# Patient Record
Sex: Male | Born: 1950
Health system: Southern US, Community
[De-identification: ages and names within clinical notes are randomized; demographics above are authoritative.]

## PROBLEM LIST (undated history)

## (undated) DIAGNOSIS — G4700 Insomnia, unspecified: Secondary | ICD-10-CM

## (undated) DIAGNOSIS — E785 Hyperlipidemia, unspecified: Secondary | ICD-10-CM

## (undated) DIAGNOSIS — I2510 Atherosclerotic heart disease of native coronary artery without angina pectoris: Secondary | ICD-10-CM

## (undated) DIAGNOSIS — M549 Dorsalgia, unspecified: Secondary | ICD-10-CM

## (undated) DIAGNOSIS — Z9289 Personal history of other medical treatment: Secondary | ICD-10-CM

## (undated) DIAGNOSIS — I219 Acute myocardial infarction, unspecified: Secondary | ICD-10-CM

## (undated) DIAGNOSIS — J189 Pneumonia, unspecified organism: Secondary | ICD-10-CM

## (undated) DIAGNOSIS — IMO0001 Reserved for inherently not codable concepts without codable children: Secondary | ICD-10-CM

## (undated) DIAGNOSIS — R351 Nocturia: Secondary | ICD-10-CM

## (undated) HISTORY — PX: HERNIA REPAIR: SHX51

## (undated) HISTORY — DX: Hyperlipidemia, unspecified: E78.5

## (undated) HISTORY — PX: TONSILLECTOMY AND ADENOIDECTOMY: SHX28

---

## 2001-06-01 ENCOUNTER — Emergency Department (HOSPITAL_COMMUNITY): Admission: EM | Admit: 2001-06-01 | Discharge: 2001-06-01 | Payer: Self-pay | Admitting: Emergency Medicine

## 2001-06-21 ENCOUNTER — Emergency Department (HOSPITAL_COMMUNITY): Admission: EM | Admit: 2001-06-21 | Discharge: 2001-06-21 | Payer: Self-pay | Admitting: Emergency Medicine

## 2012-01-03 ENCOUNTER — Encounter: Payer: Self-pay | Admitting: Family Medicine

## 2012-02-15 ENCOUNTER — Ambulatory Visit (INDEPENDENT_AMBULATORY_CARE_PROVIDER_SITE_OTHER): Payer: Managed Care, Other (non HMO) | Admitting: Family Medicine

## 2012-02-15 ENCOUNTER — Encounter: Payer: Self-pay | Admitting: Family Medicine

## 2012-02-15 ENCOUNTER — Ambulatory Visit: Payer: Self-pay

## 2012-02-15 VITALS — BP 115/76 | HR 101 | Temp 97.7°F | Resp 18 | Ht 66.5 in | Wt 156.0 lb

## 2012-02-15 DIAGNOSIS — R5381 Other malaise: Secondary | ICD-10-CM

## 2012-02-15 DIAGNOSIS — F17200 Nicotine dependence, unspecified, uncomplicated: Secondary | ICD-10-CM

## 2012-02-15 DIAGNOSIS — E7800 Pure hypercholesterolemia, unspecified: Secondary | ICD-10-CM

## 2012-02-15 DIAGNOSIS — Z720 Tobacco use: Secondary | ICD-10-CM

## 2012-02-15 DIAGNOSIS — Z125 Encounter for screening for malignant neoplasm of prostate: Secondary | ICD-10-CM

## 2012-02-15 DIAGNOSIS — Z0000 Encounter for general adult medical examination without abnormal findings: Secondary | ICD-10-CM

## 2012-02-15 DIAGNOSIS — R634 Abnormal weight loss: Secondary | ICD-10-CM

## 2012-02-15 DIAGNOSIS — T148XXA Other injury of unspecified body region, initial encounter: Secondary | ICD-10-CM

## 2012-02-15 DIAGNOSIS — R5383 Other fatigue: Secondary | ICD-10-CM

## 2012-02-15 LAB — POCT URINALYSIS DIPSTICK
Blood, UA: NEGATIVE
Glucose, UA: NEGATIVE
Ketones, UA: NEGATIVE
Leukocytes, UA: NEGATIVE
Nitrite, UA: NEGATIVE
Protein, UA: NEGATIVE
Spec Grav, UA: >=1.03
Urobilinogen, UA: 0.2
pH, UA: 5.5

## 2012-02-15 LAB — COMPREHENSIVE METABOLIC PANEL
ALT: 28 U/L (ref 0–53)
AST: 26 U/L (ref 0–37)
Albumin: 3.8 g/dL (ref 3.5–5.2)
Alkaline Phosphatase: 81 U/L (ref 39–117)
BUN: 16 mg/dL (ref 6–23)
CO2: 26 mEq/L (ref 19–32)
Calcium: 9.2 mg/dL (ref 8.4–10.5)
Chloride: 105 mEq/L (ref 96–112)
Creat: 0.93 mg/dL (ref 0.50–1.35)
Glucose, Bld: 87 mg/dL (ref 70–99)
Potassium: 4.4 mEq/L (ref 3.5–5.3)
Sodium: 140 mEq/L (ref 135–145)
Total Bilirubin: 0.3 mg/dL (ref 0.3–1.2)
Total Protein: 7.3 g/dL (ref 6.0–8.3)

## 2012-02-15 LAB — LIPID PANEL
Cholesterol: 229 mg/dL — ABNORMAL HIGH (ref 0–200)
HDL: 69 mg/dL (ref >39)
LDL Cholesterol: 145 mg/dL — ABNORMAL HIGH (ref 0–99)
Total CHOL/HDL Ratio: 3.3 Ratio
Triglycerides: 75 mg/dL (ref <150)
VLDL: 15 mg/dL (ref 0–40)

## 2012-02-15 LAB — CBC WITH DIFFERENTIAL/PLATELET
Basophils Absolute: 0.1 10*3/uL (ref 0.0–0.1)
Basophils Relative: 1 % (ref 0–1)
Eosinophils Absolute: 0.3 10*3/uL (ref 0.0–0.7)
Eosinophils Relative: 5 % (ref 0–5)
HCT: 43.8 % (ref 39.0–52.0)
Hemoglobin: 15.1 g/dL (ref 13.0–17.0)
Lymphocytes Relative: 38 % (ref 12–46)
Lymphs Abs: 2.4 10*3/uL (ref 0.7–4.0)
MCH: 29.7 pg (ref 26.0–34.0)
MCHC: 34.5 g/dL (ref 30.0–36.0)
MCV: 86.1 fL (ref 78.0–100.0)
Monocytes Absolute: 0.7 10*3/uL (ref 0.1–1.0)
Monocytes Relative: 11 % (ref 3–12)
Neutro Abs: 2.8 10*3/uL (ref 1.7–7.7)
Neutrophils Relative %: 45 % (ref 43–77)
Platelets: 223 10*3/uL (ref 150–400)
RBC: 5.09 MIL/uL (ref 4.22–5.81)
RDW: 14.6 % (ref 11.5–15.5)
WBC: 6.2 10*3/uL (ref 4.0–10.5)

## 2012-02-15 LAB — IFOBT (OCCULT BLOOD): IFOBT: NEGATIVE

## 2012-02-15 LAB — PSA: PSA: 1.13 ng/mL (ref <=4.00)

## 2012-02-15 NOTE — Patient Instructions (Addendum)
Keeping you healthy  Get these tests  Blood pressure- Have your blood pressure checked once a year by your healthcare provider.  Normal blood pressure is 120/80  Weight- Have your body mass index (BMI) calculated to screen for obesity.  BMI is a measure of body fat based on height and weight. You can also calculate your own BMI at [email or web address].  Cholesterol- Have your cholesterol checked every year.  Diabetes- Have your blood sugar checked regularly if you have high blood pressure, high cholesterol, have a family history of diabetes or if you are overweight.  Screening for Colon Cancer- Colonoscopy starting at age 23.  Screening may begin sooner depending on your family history and other health conditions. Follow up colonoscopy as directed by your Gastroenterologist.  Screening for Prostate Cancer- Both blood work (PSA) and a rectal exam help screen for Prostate Cancer.  Screening begins at age 70 with African-American men and at age 29 with Caucasian men.  Screening may begin sooner depending on your family history.  Take these medicines  Aspirin- One aspirin daily can help prevent Heart disease and Stroke.  Flu shot- Every fall.  Tetanus- Every 10 years.  Zostavax- Once after the age of 10 to prevent Shingles.   Pneumonia shot- Once after the age of 31; if you are younger than 45, ask your healthcare provider if you need a Pneumonia shot.  Take these steps  Don't smoke- If you do smoke, talk to your doctor about quitting.  For tips on how to quit, go to www.smokefree.gov or call 1-800-QUIT-NOW.  Be physically active- Exercise 5 days a week for at least 30 minutes.  If you are not already physically active start slow and gradually work up to 30 minutes of moderate physical activity.  Examples of moderate activity include walking briskly, mowing the yard, dancing, swimming, bicycling, etc.  Eat a healthy diet- Eat a variety of healthy food such as fruits, vegetables, low  fat milk, low fat cheese, yogurt, lean meant, poultry, fish, beans, tofu, etc. For more information go to www.thenutritionsource.org  Drink alcohol in moderation- Limit alcohol intake to less than two drinks a day. Never drink and drive.  Dentist- Brush and floss twice daily; visit your dentist twice a year.  Depression- Your emotional health is as important as your physical health. If you're feeling down, or losing interest in things you would normally enjoy please talk to your healthcare provider.  Eye exam- Visit your eye doctor every year.  Safe sex- If you may be exposed to a sexually transmitted infection, use a condom.  Seat belts- Seat belts can save your life; always wear one.  Smoke/Carbon Monoxide detectors- These detectors need to be installed on the appropriate level of your home.  Replace batteries at least once a year.  Skin cancer- When out in the sun, cover up and use sunscreen 15 SPF or higher.  Violence- If anyone is threatening you, please tell your healthcare provider.  Living Will/ Health care power of attorney- Speak with your healthcare provider and family.    Smoking Cessation This document explains the best ways for you to quit smoking and new treatments to help. It lists new medicines that can double or triple your chances of quitting and quitting for good. It also considers ways to avoid relapses and concerns you may have about quitting, including weight gain. NICOTINE: A POWERFUL ADDICTION If you have tried to quit smoking, you know how hard it can be. It is hard because nicotine  is a very addictive drug. For some people, it can be as addictive as heroin or cocaine. Usually, people make 2 or 3 tries, or more, before finally being able to quit. Each time you try to quit, you can learn about what helps and what hurts. Quitting takes hard work and a lot of effort, but you can quit smoking. QUITTING SMOKING IS ONE OF THE MOST IMPORTANT THINGS YOU WILL EVER  DO.  You will live longer, feel better, and live better.   The impact on your body of quitting smoking is felt almost immediately:   Within 20 minutes, blood pressure decreases. Pulse returns to its normal level.   After 8 hours, carbon monoxide levels in the blood return to normal. Oxygen level increases.   After 24 hours, chance of heart attack starts to decrease. Breath, hair, and body stop smelling like smoke.   After 48 hours, damaged nerve endings begin to recover. Sense of taste and smell improve.   After 72 hours, the body is virtually free of nicotine. Bronchial tubes relax and breathing becomes easier.   After 2 to 12 weeks, lungs can hold more air. Exercise becomes easier and circulation improves.   Quitting will reduce your risk of having a heart attack, stroke, cancer, or lung disease:   After 1 year, the risk of coronary heart disease is cut in half.   After 5 years, the risk of stroke falls to the same as a nonsmoker.   After 10 years, the risk of lung cancer is cut in half and the risk of other cancers decreases significantly.   After 15 years, the risk of coronary heart disease drops, usually to the level of a nonsmoker.   If you are pregnant, quitting smoking will improve your chances of having a healthy baby.   The people you live with, especially your children, will be healthier.   You will have extra money to spend on things other than cigarettes.  FIVE KEYS TO QUITTING Studies have shown that these 5 steps will help you quit smoking and quit for good. You have the best chances of quitting if you use them together: 1. Get ready.  2. Get support and encouragement.  3. Learn new skills and behaviors.  4. Get medicine to reduce your nicotine addiction and use it correctly.  5. Be prepared for relapse or difficult situations. Be determined to continue trying to quit, even if you do not succeed at first.  1. GET READY  Set a quit date.   Change your  environment.   Get rid of ALL cigarettes, ashtrays, matches, and lighters in your home, car, and place of work.   Do not let people smoke in your home.   Review your past attempts to quit. Think about what worked and what did not.   Once you quit, do not smoke. NOT EVEN A PUFF!  2. GET SUPPORT AND ENCOURAGEMENT Studies have shown that you have a better chance of being successful if you have help. You can get support in many ways.  Tell your family, friends, and coworkers that you are going to quit and need their support. Ask them not to smoke around you.   Talk to your caregivers (doctor, dentist, nurse, pharmacist, psychologist, and/or smoking counselor).   Get individual, group, or telephone counseling and support. The more counseling you have, the better your chances are of quitting. Programs are available at [employer] and health centers. Call your local health department for information  about programs in your area.   Spiritual beliefs and practices may help some smokers quit.   Quit meters are Photographer that keep track of quit statistics, such as amount of "quit-time," cigarettes not smoked, and money saved.   Many smokers find one or more of the many self-help books available useful in helping them quit and stay off tobacco.  3. LEARN NEW SKILLS AND BEHAVIORS  Try to distract yourself from urges to smoke. Talk to someone, go for a walk, or occupy your time with a task.   When you first try to quit, change your routine. Take a different route to work. Drink tea instead of coffee. Eat breakfast in a different place.   Do something to reduce your stress. Take a hot bath, exercise, or read a book.   Plan something enjoyable to do every day. Reward yourself for not smoking.   Explore interactive web-based programs that specialize in helping you quit.  4. GET MEDICINE AND USE IT CORRECTLY Medicines can help you stop smoking and decrease the  urge to smoke. Combining medicine with the above behavioral methods and support can quadruple your chances of successfully quitting smoking. The U.[place]. Food and Drug Administration (FDA) has approved 7 medicines to help you quit smoking. These medicines fall into 3 categories.  Nicotine replacement therapy (delivers nicotine to your body without the negative effects and risks of smoking):   Nicotine gum: Available over-the-counter.   Nicotine lozenges: Available over-the-counter.   Nicotine inhaler: Available by prescription.   Nicotine nasal spray: Available by prescription.   Nicotine skin patches (transdermal): Available by prescription and over-the-counter.   Antidepressant medicine (helps people abstain from smoking, but how this works is unknown):   Bupropion sustained-release (SR) tablets: Available by prescription.   Nicotinic receptor partial agonist (simulates the effect of nicotine in your brain):   Varenicline tartrate tablets: Available by prescription.   Ask your caregiver for advice about which medicines to use and how to use them. Carefully read the information on the package.   Everyone who is trying to quit may benefit from using a medicine. If you are pregnant or trying to become pregnant, nursing an infant, you are under age 90, or you smoke fewer than 10 cigarettes per day, talk to your caregiver before taking any nicotine replacement medicines.   You should stop using a nicotine replacement product and call your caregiver if you experience nausea, dizziness, weakness, vomiting, fast or irregular heartbeat, mouth problems with the lozenge or gum, or redness or swelling of the skin around the patch that does not go away.   Do not use any other product containing nicotine while using a nicotine replacement product.   Talk to your caregiver before using these products if you have diabetes, heart disease, asthma, stomach ulcers, you had a recent heart attack, you have  high blood pressure that is not controlled with medicine, a history of irregular heartbeat, or you have been prescribed medicine to help you quit smoking.  5. BE PREPARED FOR RELAPSE OR DIFFICULT SITUATIONS  Most relapses occur within the first 3 months after quitting. Do not be discouraged if you start smoking again. Remember, most people try several times before they finally quit.   You may have symptoms of withdrawal because your body is used to nicotine. You may crave cigarettes, be irritable, feel very hungry, cough often, get headaches, or have difficulty concentrating.   The withdrawal symptoms are only temporary. They are  strongest when you first quit, but they will go away within 10 to 14 days.  Here are some difficult situations to watch for:  Alcohol. Avoid drinking alcohol. Drinking lowers your chances of successfully quitting.   Caffeine. Try to reduce the amount of caffeine you consume. It also lowers your chances of successfully quitting.   Other smokers. Being around smoking can make you want to smoke. Avoid smokers.   Weight gain. Many smokers will gain weight when they quit, usually less than 10 pounds. Eat a healthy diet and stay active. Do not let weight gain distract you from your main goal, quitting smoking. Some medicines that help you quit smoking may also help delay weight gain. You can always lose the weight gained after you quit.   Bad mood or depression. There are a lot of ways to improve your mood other than smoking.  If you are having problems with any of these situations, talk to your caregiver. SPECIAL SITUATIONS AND CONDITIONS Studies suggest that everyone can quit smoking. Your situation or condition can give you a special reason to quit.  Pregnant women/new mothers: By quitting, you protect your baby's health and your own.   Hospitalized patients: By quitting, you reduce health problems and help healing.   Heart attack patients: By quitting, you reduce  your risk of a second heart attack.   Lung, head, and neck cancer patients: By quitting, you reduce your chance of a second cancer.   Parents of children and adolescents: By quitting, you protect your children from illnesses caused by secondhand smoke.  QUESTIONS TO THINK ABOUT Think about the following questions before you try to stop smoking. You may want to talk about your answers with your caregiver.  Why do you want to quit?   If you tried to quit in the past, what helped and what did not?   What will be the most difficult situations for you after you quit? How will you plan to handle them?   Who can help you through the tough times? Your family? Friends? Caregiver?   What pleasures do you get from smoking? What ways can you still get pleasure if you quit?  Here are some questions to ask your caregiver:  How can you help me to be successful at quitting?   What medicine do you think would be best for me and how should I take it?   What should I do if I need more help?   What is smoking withdrawal like? How can I get information on withdrawal?  Quitting takes hard work and a lot of effort, but you can quit smoking. FOR MORE INFORMATION  Smokefree.gov (http://www.davis-sullivan.com/) provides free, accurate, evidence-based information and professional assistance to help support the immediate and long-term needs of people trying to quit smoking. Document Released: 06/05/2001 Document Revised: 05/31/2011 Document Reviewed: 03/28/2009 [facility] Patient Information 2012 [place], [place].

## 2012-02-15 NOTE — Progress Notes (Signed)
Subjective:    Patient ID: Alexander Hunter, male    DOB: 03/18/1951, 61 y.o.   MRN: [id number]  HPI  This 61 y.o  AA male is here for CPE (last seen at [facility] in Dec 2010).  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 He is single.  He voices concerns about weight loss; he has no regula meal times and sometimes eats only once  a day stating he gets sluggish when he eats full meals. He also notes easy bruising.  He has occasional SOB with exertion but no associated symptoms. Back pain is work related.    Review of Systems  Constitutional: Positive for appetite change and unexpected weight change. Negative for fever, chills and activity change.  HENT: Negative for sore throat, trouble swallowing and dental problem.   Eyes: Positive for visual disturbance. Negative for photophobia and pain.  Respiratory: Positive for shortness of breath. Negative for cough, choking, chest tightness and wheezing.   Cardiovascular: Negative.   Gastrointestinal: Negative.   Genitourinary: Negative.   Musculoskeletal: Positive for back pain. Negative for myalgias, joint swelling, arthralgias and gait problem.  Skin: Negative.   Neurological: Negative.   Hematological: Negative for adenopathy. Bruises/bleeds easily.  Psychiatric/Behavioral: Positive for disturbed wake/sleep cycle. Negative for dysphoric mood and agitation. The patient is not nervous/anxious and is not hyperactive.        Objective:   Physical Exam  Nursing note and vitals reviewed. Constitutional: He is oriented to person, place, and time. He appears well-developed and well-nourished. No distress.  HENT:  Head: Normocephalic and atraumatic.  Right Ear: Hearing, tympanic membrane, external ear and ear canal normal.  Left Ear: Hearing, tympanic membrane, external ear and ear canal normal.  Nose: Nose normal. No nasal deformity or septal deviation.  Mouth/Throat: Uvula is midline,  oropharynx is clear and moist and mucous membranes are normal. No oral lesions. Normal dentition. No dental caries. No oropharyngeal exudate.  Eyes: Conjunctivae and EOM are normal. Pupils are equal, round, and reactive to light. No scleral icterus.       Muddy sclerae  Neck: Normal range of motion. Neck supple. No thyromegaly present.  Cardiovascular: Normal rate, regular rhythm, normal heart sounds and intact distal pulses.  Exam reveals no gallop and no friction rub.   No murmur heard. Pulmonary/Chest: Effort normal and breath sounds normal. No respiratory distress. He has no wheezes.  Abdominal: Soft. Bowel sounds are normal. He exhibits no distension and no mass. There is no hepatosplenomegaly. There is no tenderness. There is no guarding and no CVA tenderness. A hernia is present. Hernia confirmed positive in the left inguinal area. Hernia confirmed negative in the right inguinal area.  Genitourinary: Rectum normal, testes normal and penis normal. Rectal exam shows no external hemorrhoid, no mass, no tenderness and anal tone normal. Guaiac negative stool. Prostate is enlarged. Prostate is not tender. Right testis shows no mass, no swelling and no tenderness. Left testis shows no mass, no swelling and no tenderness.       Prostate firm and slightly irregular without discrete nodules.  Musculoskeletal: Normal range of motion. He exhibits no edema and no tenderness.  Lymphadenopathy:    He has no cervical adenopathy.  Neurological: He is oriented to person, place, and time. He has normal reflexes. No cranial nerve deficit. He exhibits normal muscle tone. Coordination normal.  Skin: Skin is warm and dry. No rash noted. No erythema.  Psychiatric:  He has a normal mood and affect. His behavior is normal. Judgment and thought content normal.     LABS: Dec 2010-  Total Chol= 237   HDL= 72    TGs= 57   LDL= 161   PSA= 1.32   [facility] reading (PRIMARY) by  Dr. Audria Nine:  CXR- No active disease; normal  cardiac silhouette;       Assessment & Plan:   1. Routine general medical examination at a health care facility  Comprehensive metabolic panel, IFOBT POC (occult bld, rslt in office), POCT urinalysis dipstick  2. Screening for prostate cancer  PSA; pt advised that a referral to Urology may be warranted (he is completely asymptomatic and Family Hx is negative)  3. Hypercholesteremia  Lipid panel  4. Bruising - may be related to liver disease associated with chronic alcohol ingestion CBC with Differential  5. Fatigue  Vitamin D, 25-hydroxy  6. Weight loss, non-intentional  DG Chest 2 View given long history of tobacco use anpoor nutritional habits  7. Tobacco user  Encouraged cessation

## 2012-02-16 LAB — VITAMIN D 25 HYDROXY (VIT D DEFICIENCY, FRACTURES): Vit D, 25-Hydroxy: 28 ng/mL — ABNORMAL LOW (ref 30–89)

## 2012-02-18 ENCOUNTER — Encounter: Payer: Self-pay | Admitting: Family Medicine

## 2012-02-18 DIAGNOSIS — E7800 Pure hypercholesterolemia, unspecified: Secondary | ICD-10-CM | POA: Insufficient documentation

## 2012-02-18 DIAGNOSIS — Z720 Tobacco use: Secondary | ICD-10-CM | POA: Insufficient documentation

## 2012-02-18 NOTE — Progress Notes (Signed)
Quick Note:  Please call pt and advise that the following labs are abnormal... Vitamin D is below normal; this may be part of the reason you are tired (also need to eat healthy, regular meals). Get OTC Vitamin D 2000 IU and take 1 capsule daily. Get 10- 15 minutes of sun exposure most days of the week.  Also get OTC Omega 3 fish Oil capsule 1200 mg and take 1 daily to help get your cholesterol profile to normal. Your total cholesterol and LDL ("bad") cholesterol are to high.  Your prostate blood test is normal. Your CBC (complete blood counts) are normal. Your chemistries (sodium, potasium, blood sugar, kidney and liver tests) are normal.  Copy to pt. ______

## 2012-02-19 ENCOUNTER — Telehealth: Payer: Self-pay

## 2012-02-19 NOTE — Telephone Encounter (Signed)
PT STATES HE RECEIVED A CALL YESTERDAY REGARDING HIS LABS. PLEASE CALL [phone]

## 2012-02-19 NOTE — Telephone Encounter (Signed)
Left message for him to call me back to discuss labs.

## 2012-02-20 NOTE — Telephone Encounter (Signed)
Patient has been advised of lab results. Alexander Hunter and Alexander Hunter have spoken to him.

## 2012-04-17 ENCOUNTER — Telehealth: Payer: Self-pay

## 2012-04-17 NOTE — Telephone Encounter (Signed)
Left message on machine, that [facility] did not have patients insurance card.  I have faxed a copy of the card to [facility] and had them file labs under V70.0 only.  Patient to call Loney Loh if he has any questions

## 2013-03-07 ENCOUNTER — Ambulatory Visit (INDEPENDENT_AMBULATORY_CARE_PROVIDER_SITE_OTHER): Payer: BC Managed Care – PPO | Admitting: Emergency Medicine

## 2013-03-07 VITALS — BP 118/70 | HR 94 | Temp 98.6°F | Resp 18 | Ht 67.0 in | Wt 159.0 lb

## 2013-03-07 DIAGNOSIS — J0100 Acute maxillary sinusitis, unspecified: Secondary | ICD-10-CM

## 2013-03-07 MED ORDER — PSEUDOEPHEDRINE-GUAIFENESIN ER 60-600 MG PO TB12: 1.0000 | ORAL_TABLET | Freq: Two times a day (BID) | ORAL | Status: DC

## 2013-03-07 MED ORDER — AMOXICILLIN-POT CLAVULANATE 875-125 MG PO TABS: 1.0000 | ORAL_TABLET | Freq: Two times a day (BID) | ORAL | Status: DC

## 2013-03-07 NOTE — Patient Instructions (Addendum)

## 2013-03-07 NOTE — Progress Notes (Signed)
  Subjective:     Alexander Hunter is a 62 y.o. male who presents for evaluation of sinus pain. Symptoms include: congestion, facial pain, frequent clearing of the throat, nasal congestion, post nasal drip, sinus pressure and tooth pain. Onset of symptoms was 3 weeks ago. Symptoms have been gradually worsening since that time. Past history is significant for no history of pneumonia or bronchitis. Patient is a smoker  Has brown nasal drainage.  Has been treating himself with amoxicillin for four doses.  The following portions of the patient's history were reviewed and updated as appropriate: allergies, current medications, past family history, past medical history, past social history, past surgical history and problem list.  Review of Systems A comprehensive review of systems was negative.   Objective:    BP 118/70  Pulse 94  Temp(Src) 98.6 F (37 C) (Oral)  Resp 18  Ht 5\' 7"  (1.702 m)  Wt 159 lb (72.122 kg)  BMI 24.9 kg/m2  SpO2 99%  General Appearance:    Alert, cooperative, no distress, appears stated age  Head:    Normocephalic, without obvious abnormality, atraumatic  Eyes:    PERRL, conjunctiva/corneas clear, EOM's intact, fundi    benign, both eyes       Ears:    Normal TM's and external ear canals, both ears  Nose:   Nares normal, septum midline, mucosa normal, no drainage    or sinus tenderness  Throat:   Lips, mucosa, and tongue normal; teeth and gums normal  Neck:   Supple, symmetrical, trachea midline, no adenopathy;       thyroid:  No enlargement/tenderness/nodules; no carotid   bruit or JVD  Back:     Symmetric, no curvature, ROM normal, no CVA tenderness  Lungs:     Clear to auscultation bilaterally, respirations unlabored  Chest wall:    No tenderness or deformity  Heart:    Regular rate and rhythm, S1 and S2 normal, no murmur, rub   or gallop           Extremities:   Extremities normal, atraumatic, no cyanosis or edema     Skin:   Skin color, texture, turgor  normal, no rashes or lesions  Lymph nodes:   Cervical, supraclavicular, and axillary nodes normal  Neurologic:   CNII-XII intact. Normal strength, sensation and reflexes      throughout      Assessment:    Acute bacterial sinusitis.    Plan:    Augmentin per medication orders.

## 2013-04-01 ENCOUNTER — Other Ambulatory Visit: Payer: Self-pay | Admitting: Emergency Medicine

## 2014-11-24 DIAGNOSIS — Z9289 Personal history of other medical treatment: Secondary | ICD-10-CM

## 2014-11-24 DIAGNOSIS — I219 Acute myocardial infarction, unspecified: Secondary | ICD-10-CM

## 2014-11-24 HISTORY — DX: Acute myocardial infarction, unspecified: I21.9

## 2014-11-24 HISTORY — DX: Personal history of other medical treatment: Z92.89

## 2014-11-24 HISTORY — PX: CORONARY ARTERY BYPASS GRAFT: SHX141

## 2014-12-16 HISTORY — PX: CARDIAC CATHETERIZATION: SHX172

## 2014-12-16 LAB — LIPID PANEL
CHOLESTEROL: 222 mg/dL — AB (ref 0–200)
HDL: 93 mg/dL — AB (ref 35–70)
LDL Cholesterol: 120 mg/dL
Triglycerides: 44 mg/dL (ref 40–160)

## 2014-12-26 LAB — BASIC METABOLIC PANEL
BUN: 20 mg/dL (ref 4–21)
Creatinine: 1 mg/dL (ref <=1.3)

## 2014-12-29 LAB — CBC AND DIFFERENTIAL
HEMATOCRIT: 33 % — AB (ref 41–53)
HEMOGLOBIN: 10.7 g/dL — AB (ref 13.5–17.5)
PLATELETS: 385 10*3/uL (ref 150–399)
WBC: 10.6 10^3/mL

## 2014-12-31 ENCOUNTER — Non-Acute Institutional Stay (SKILLED_NURSING_FACILITY): Payer: 59 | Admitting: Adult Health

## 2014-12-31 ENCOUNTER — Encounter: Payer: Self-pay | Admitting: Adult Health

## 2014-12-31 DIAGNOSIS — I10 Essential (primary) hypertension: Secondary | ICD-10-CM | POA: Diagnosis not present

## 2014-12-31 DIAGNOSIS — K5900 Constipation, unspecified: Secondary | ICD-10-CM

## 2014-12-31 DIAGNOSIS — I25119 Atherosclerotic heart disease of native coronary artery with unspecified angina pectoris: Secondary | ICD-10-CM | POA: Diagnosis not present

## 2014-12-31 DIAGNOSIS — I213 ST elevation (STEMI) myocardial infarction of unspecified site: Secondary | ICD-10-CM

## 2014-12-31 DIAGNOSIS — R5381 Other malaise: Secondary | ICD-10-CM

## 2014-12-31 DIAGNOSIS — E785 Hyperlipidemia, unspecified: Secondary | ICD-10-CM | POA: Diagnosis not present

## 2015-01-02 DIAGNOSIS — E785 Hyperlipidemia, unspecified: Secondary | ICD-10-CM | POA: Insufficient documentation

## 2015-01-02 DIAGNOSIS — I10 Essential (primary) hypertension: Secondary | ICD-10-CM | POA: Insufficient documentation

## 2015-01-02 DIAGNOSIS — R5381 Other malaise: Secondary | ICD-10-CM | POA: Insufficient documentation

## 2015-01-02 DIAGNOSIS — I2510 Atherosclerotic heart disease of native coronary artery without angina pectoris: Secondary | ICD-10-CM | POA: Insufficient documentation

## 2015-01-02 DIAGNOSIS — I213 ST elevation (STEMI) myocardial infarction of unspecified site: Secondary | ICD-10-CM | POA: Insufficient documentation

## 2015-01-02 NOTE — Progress Notes (Signed)
Patient ID: Alexander Hunter, male   DOB: 04-24-1951, 64 y.o.   MRN: [id number]   12/31/14  Facility:  Nursing Home Location:  [facility] Room Number: 605-P LEVEL OF CARE:  SNF (31)   Chief Complaint  Patient presents with  . Hospitalization Follow-up    Physical deconditioning, STEMI, CAD S/P CABG X 4, hypertension, hyperlipidemia and constipation    HISTORY OF PRESENT ILLNESS:  This is a 64 year old male who has been admitted to [facility] on 12/30/14 from [facility]. He has PMH of hyperlipidemia and hernia. He presented to the hospital with chest tightness, 8/10 in severity and does not radiate. He reported being dyspnea on exertion for quite a while, like this several months. He was diagnosed with STEMI and was found to have severe 3 vessel CAD on cardiac catheterization. He then had CABG 4 (LIMA to LAD, SVG to intermediate, PDA and OM) and resection of right upper lobe on 12/21/14  He has been admitted for a short-term rehabilitation.  PAST MEDICAL HISTORY:  Past Medical History  Diagnosis Date  . HLD (hyperlipidemia)   . Hernia   . Heme positive stool     CURRENT MEDICATIONS: Reviewed per MAR/see medication list  No Known Allergies   REVIEW OF SYSTEMS:  GENERAL: no change in appetite, no fatigue, no weight changes, no fever, chills or weakness RESPIRATORY: no cough, SOB, DOE, wheezing, hemoptysis CARDIAC: no chest pain, edema or palpitation GI: no abdominal pain, diarrhea, constipation, heart burn, nausea or vomiting  PHYSICAL EXAMINATION  GENERAL: no acute distress, normal body habitus SKIN:  Midline chest surgical wound is dry, no erythema; left inner side of the surgical site is dry, no erythema EYES: conjunctivae normal, sclerae normal, normal eye lids NECK: supple, trachea midline, no neck masses, no thyroid tenderness, no thyromegaly LYMPHATICS: no LAN in the neck, no supraclavicular LAN RESPIRATORY: breathing is even &  unlabored, BS CTAB CARDIAC: RRR, no murmur,no extra heart sounds, no edema GI: abdomen soft, normal BS, no masses, no tenderness, no hepatomegaly, no splenomegaly EXTREMITIES:  Able to move 4 extremities; ambulatory PSYCHIATRIC: the patient is alert & oriented to person, affect & behavior appropriate  LABS/RADIOLOGY: Labs reviewed: Basic Metabolic Panel:  Recent Labs  40/37/54  BUN 20  CREATININE 1.0   CBC:  Recent Labs  12/29/14  WBC 10.6  HGB 10.7*  HCT 33*  PLT 385   Lipid Panel:  Recent Labs  12/16/14  HDL 93*    ASSESSMENT/PLAN:  Physical deconditioning - for rehabilitation  STEMI - follow-up with Dr.Saeed Payvar, cardiology, in 1 month; continue aspirin 325 mg 1 tab by mouth daily  Severe CAD S/P CABG 4 - for rehabilitation; follow-up with Dr. Lynetta Mare, cardiothoracic surgery, in 2-3 weeks; continue oxycodone 5 mg 1 tab by mouth every 4 hours when necessary for pain; check CBC and BMP  Hypertension - well-controlled; continue Lopressor 25 mg 1 tab by mouth twice a day  Hyperlipidemia - continue Pravachol 40 mg 1 tab by mouth daily at bedtime  Constipation - continue Colace 100 mg 1 capsule by mouth twice a day   Goals of care:  Short-term rehabilitation     [facility], NP [facility] [phone]

## 2015-01-03 ENCOUNTER — Non-Acute Institutional Stay (SKILLED_NURSING_FACILITY): Payer: 59 | Admitting: Internal Medicine

## 2015-01-03 DIAGNOSIS — R5381 Other malaise: Secondary | ICD-10-CM | POA: Diagnosis not present

## 2015-01-03 DIAGNOSIS — K469 Unspecified abdominal hernia without obstruction or gangrene: Secondary | ICD-10-CM | POA: Insufficient documentation

## 2015-01-03 DIAGNOSIS — K4020 Bilateral inguinal hernia, without obstruction or gangrene, not specified as recurrent: Secondary | ICD-10-CM | POA: Diagnosis not present

## 2015-01-03 DIAGNOSIS — K5901 Slow transit constipation: Secondary | ICD-10-CM

## 2015-01-03 DIAGNOSIS — D62 Acute posthemorrhagic anemia: Secondary | ICD-10-CM

## 2015-01-03 DIAGNOSIS — I10 Essential (primary) hypertension: Secondary | ICD-10-CM | POA: Diagnosis not present

## 2015-01-03 DIAGNOSIS — I25119 Atherosclerotic heart disease of native coronary artery with unspecified angina pectoris: Secondary | ICD-10-CM

## 2015-01-03 DIAGNOSIS — E785 Hyperlipidemia, unspecified: Secondary | ICD-10-CM | POA: Diagnosis not present

## 2015-01-03 DIAGNOSIS — K219 Gastro-esophageal reflux disease without esophagitis: Secondary | ICD-10-CM

## 2015-01-03 LAB — BASIC METABOLIC PANEL
BUN: 22 mg/dL — AB (ref 4–21)
CREATININE: 1.1 mg/dL (ref <=1.3)
Glucose: 90 mg/dL
Potassium: 5 mmol/L (ref 3.4–5.3)
SODIUM: 136 mmol/L — AB (ref 137–147)

## 2015-01-03 NOTE — Progress Notes (Signed)
Patient ID: Alexander Hunter, male   DOB: [birth year], 64 y.o.   MRN: [id number]     [facility]   PCP: No primary care provider on file.  Code Status: full code  No Known Allergies  Chief Complaint  Patient presents with  . New Admit To SNF     HPI:  64 y.o. year old patient is here for short term rehabilitation post hospital admission from 12/16/14-12/30/14 with STEMI and severe 3 vessel CAD on cardiac catheterization. He underwent CABG 4 (LIMA to LAD, SVG to intermediate, PDA and OM) and resection of right upper lobe on 12/21/14.He has PMH of hyperlipidemia and hernia. He is seen in his room today. E has been working with therapy team. He is walking around the hall unassisted. He complaints of chest soreness and constipation. He denies any there concerns.   Review of Systems:  Constitutional: Negative for fever, chills, diaphoresis.  HENT: Negative for headache, congestion, nasal discharge Eyes: Negative for eye pain, blurred vision, double vision and discharge.  Respiratory: Negative for cough, wheezing.  occasional dyspnea with exertion which is improving Cardiovascular: Negative for chest pain, palpitations, leg swelling.  Gastrointestinal: Negative for heartburn, nausea, vomiting, abdominal pain Genitourinary: Negative for dysuria and flank pain.  Musculoskeletal: Negative for back pain, falls Skin: Negative for itching, rash.  Neurological: Negative for dizziness, tingling, focal weakness Psychiatric/Behavioral: Negative for depression, anxiety, insomnia and memory loss.    Past Medical History  Diagnosis Date  . HLD (hyperlipidemia)   . Hernia   . Heme positive stool    Past Surgical History  Procedure Laterality Date  . Tonsillectomy and adenoidectomy  1961  . Fracture surgery     Social History:   reports that he has been smoking.  He does not have any smokeless tobacco history on file. He reports that he drinks alcohol. His drug  history is not on file.  Family History  Problem Relation Age of Onset  . Heart attack Mother   . Heart disease Mother     Heart attack  . Heart attack Father   . Heart disease Father     Heart attack  . Heart attack Maternal Grandmother   . Heart disease Maternal Grandmother     Heart attack  . Heart attack Maternal Grandfather   . Heart disease Maternal Grandfather     Heart attack    Medications: Patient's Medications  New Prescriptions   No medications on file  Previous Medications   ACETAMINOPHEN (TYLENOL) 500 MG TABLET    Take 500 mg by mouth every 6 (six) hours as needed.   ASPIRIN 325 MG TABLET    Take 325 mg by mouth daily.   DOCUSATE SODIUM (COLACE) 100 MG CAPSULE    Take 100 mg by mouth 2 (two) times daily. Start date 12/30/14 end 01/09/15   FAMOTIDINE (PEPCID) 20 MG TABLET    Take 20 mg by mouth 2 (two) times daily.   METOPROLOL TARTRATE (LOPRESSOR) 25 MG TABLET    Take 25 mg by mouth 2 (two) times daily. Start date 12/30/14 end 01/09/15   OXYCODONE (OXY IR/ROXICODONE) 5 MG IMMEDIATE RELEASE TABLET    Take 5 mg by mouth every 4 (four) hours as needed for severe pain. Start date 12/30/14 end 01/09/15   PRAVASTATIN (PRAVACHOL) 40 MG TABLET    Take 40 mg by mouth at bedtime. Start date 12/30/14 end date 01/09/15  Modified Medications   No medications on file  Discontinued Medications  No medications on file     Physical Exam: Filed Vitals:   01/03/15 1256  BP: 134/82  Pulse: 89  Temp: 98.5 F (36.9 C)  Resp: 18  Weight: 144 lb (65.318 kg)  SpO2: 90%    General- elderly male, well built, in no acute distress Head- normocephalic, atraumatic Throat- moist mucus membrane Eyes- PERRLA, EOMI, no pallor, no icterus, no discharge, normal conjunctiva, normal sclera Neck- no cervical lymphadenopathy,  no jugular vein distension Chest- sternal incision healing well Cardiovascular- normal s1,s2, no murmurs, palpable dorsalis pedis, no leg edema Respiratory- bilateral clear  to auscultation, no wheeze, no rhonchi, no crackles, no use of accessory muscles Abdomen- bowel sounds present, soft, non tender, bilateral inguinal hernia with bulge in groin area noted, non tender Musculoskeletal- able to move all 4 extremities Neurological- no focal deficit, alert and oriented to person, place and time Skin- warm and dry, left groin and thigh area incision healing well. Bruising noted on left thigh.  Psychiatry- normal mood and affect    Labs reviewed: Basic Metabolic Panel:  Recent Labs  04/54/09  BUN 20  CREATININE 1.0   CBC:  Recent Labs  12/29/14  WBC 10.6  HGB 10.7*  HCT 33*  PLT 385    Assessment/Plan  Physical deconditioning Will have him work with physical therapy and occupational therapy team to help with gait training and muscle strengthening exercises.fall precautions. Skin care. Encourage to be out of bed.   CAD s/p CABG Chest pain free. Continue aspirin 325 mg daily with lopressor 25 mg bid and pravachol 40 mg daily for now. Has follow up with cardiology and cardiothoracic surgery. continue oxycodone 5 mg 1 tab every 4 hours when necessary for pain. Continue skin care  Constipation  continue Colace 100 mg bid and add miralax 17 g bid x 3 days, then daily, hydration encouraged  Acute blood loss anemia Likely post op, monitor h&h  Hypertension  Stable bp reading, continue Lopressor 25 mg bid and monitor bp  Hyperlipidemia continue Pravachol 40 mg daily  gerd Continue famotidine 20 mg daily, symptoms controlled.  Inguinal hernia Stable, monitor clinically   Goals of care: short term rehabilitation   Labs/tests ordered: cbc  Family/ staff Communication: reviewed care plan with patient and nursing supervisor    Oneal Grout, MD  [facility] [phone] (Monday-Friday 8 am - 5 pm) [phone] (afterhours)

## 2015-01-07 LAB — CBC AND DIFFERENTIAL
HEMATOCRIT: 31 % — AB (ref 41–53)
HEMOGLOBIN: 10.1 g/dL — AB (ref 13.5–17.5)
Platelets: 525 10*3/uL — AB (ref 150–399)
WBC: 9.3 10*3/mL

## 2015-01-10 ENCOUNTER — Encounter: Payer: Self-pay | Admitting: Internal Medicine

## 2015-01-10 ENCOUNTER — Non-Acute Institutional Stay (SKILLED_NURSING_FACILITY): Payer: 59 | Admitting: Internal Medicine

## 2015-01-10 DIAGNOSIS — R208 Other disturbances of skin sensation: Secondary | ICD-10-CM

## 2015-01-10 DIAGNOSIS — R600 Localized edema: Secondary | ICD-10-CM

## 2015-01-10 DIAGNOSIS — D649 Anemia, unspecified: Secondary | ICD-10-CM

## 2015-01-10 DIAGNOSIS — L7682 Other postprocedural complications of skin and subcutaneous tissue: Secondary | ICD-10-CM

## 2015-01-10 NOTE — Progress Notes (Signed)
Patient ID: Famous Speller, male   DOB: Aug 29, 1950, 64 y.o.   MRN: [id number]   [facility]  Code Status: Full Code  Chief Complaint  Patient presents with  . Acute Visit    Left leg redness and pain     No Known Allergies  HPI:  64 y.o. patient is seen today with concern for left leg redness with swelling. he is here for short term rehabilitation post STEMI and severe 3 vessel CAD s/p CABG 4 (LIMA to LAD, SVG to intermediate, PDA and OM) and resection of right upper lobe on 12/21/14. He is seen in his room. He mentions that the swelling has subsided. He complaints of occasional discomfort to incision site on his left leg. On review of labs, he has slight drop in his hb from time of discharge.  Review of Systems:  Constitutional: Negative for fever, chills, diaphoresis.  HENT: Negative for headache, congestion, nasal discharge Eyes: Negative for eye pain, blurred vision, double vision and discharge.  Respiratory: Negative for cough, wheezing.  occasional dyspnea with exertion which is improving Cardiovascular: Negative for chest pain, palpitations. Gastrointestinal: Negative for heartburn, nausea, vomiting, abdominal pain Genitourinary: Negative for dysuria and flank pain.  Musculoskeletal: Negative for back pain, falls Skin: Negative for itching, rash.  Neurological: Negative for dizziness, tingling, focal weakness Psychiatric/Behavioral: Negative for depression, anxiety, insomnia and memory loss.   Past Medical History  Diagnosis Date  . HLD (hyperlipidemia)   . Hernia   . Heme positive stool       Medication List       This list is accurate as of: 01/10/15 10:41 AM.  Always use your most recent med list.               aspirin 325 MG tablet  Take 325 mg by mouth daily.     docusate sodium 100 MG capsule  Commonly known as:  COLACE  Take 100 mg by mouth 2 (two) times daily. Start date 12/30/14 end 01/09/15     metoprolol tartrate 25 MG tablet  Commonly  known as:  LOPRESSOR  Take 25 mg by mouth 2 (two) times daily. Start date 12/30/14 end 01/09/15     oxyCODONE 5 MG immediate release tablet  Commonly known as:  Oxy IR/ROXICODONE  Take 10 mg by mouth every 4 (four) hours as needed for severe pain. For pain 6-10     PEPCID 20 MG tablet  Generic drug:  famotidine  Take 20 mg by mouth 2 (two) times daily.     polyethylene glycol packet  Commonly known as:  MIRALAX / GLYCOLAX  Take 17 g by mouth daily.     pravastatin 40 MG tablet  Commonly known as:  PRAVACHOL  Take 40 mg by mouth at bedtime. Start date 12/30/14 end date 01/09/15        Physical exam BP 93/61 mmHg  Pulse 78  Temp(Src) 98.5 F (36.9 C) (Oral)  Resp 18  SpO2 99%  General- elderly male, well built, in no acute distress Head- normocephalic, atraumatic Throat- moist mucus membrane Neck- no cervical lymphadenopathy,  no jugular vein distension Chest- sternal incision healing well Cardiovascular- normal s1,s2, no murmurs, palpable dorsalis pedis, trace symmetrical leg edema Respiratory- bilateral clear to auscultation, no wheeze, no rhonchi, no crackles, no use of accessory muscles Abdomen- bowel sounds present, soft, non tender Musculoskeletal- able to move all 4 extremities Skin- warm and dry, left groin and thigh area incision healing well. Resolving Bruising  noted on left thigh. Left thigh area incision area has scab formation, non tender to touch Psychiatry- normal mood and affect  Labs CBC Latest Ref Rng 01/07/2015 12/29/2014 02/15/2012  WBC - 9.3 10.6 6.2  Hemoglobin 13.5 - 17.5 g/dL 10.1(A) 10.7(A) 15.1  Hematocrit 41 - 53 % 31(A) 33(A) 43.8  Platelets 150 - 399 K/L 525(A) 385 223    Assessment/plan  Leg edema Unchanged, symmetric trace edema. No signs of dvt on exam. No signs of infection. Keep leg elevated at rest and monitor  Anemia With further drop in hb. No signs of bleeding. Monitor h&h. Start feso4 325 mg bid for now.   Surgical incision  pain Intermittent, no erythema or pain on exam. Pt complaints of occasional discomfort. Add tylenol 650 mg tid for 5 days and reassess  Oneal Grout, MD  [facility] [phone] (Monday-Friday 8 am - 5 pm) [phone] (afterhours)

## 2015-01-12 ENCOUNTER — Encounter: Payer: Self-pay | Admitting: Adult Health

## 2015-01-12 ENCOUNTER — Non-Acute Institutional Stay (SKILLED_NURSING_FACILITY): Payer: 59 | Admitting: Adult Health

## 2015-01-12 DIAGNOSIS — R5381 Other malaise: Secondary | ICD-10-CM | POA: Diagnosis not present

## 2015-01-12 DIAGNOSIS — I213 ST elevation (STEMI) myocardial infarction of unspecified site: Secondary | ICD-10-CM | POA: Diagnosis not present

## 2015-01-12 DIAGNOSIS — I10 Essential (primary) hypertension: Secondary | ICD-10-CM | POA: Diagnosis not present

## 2015-01-12 DIAGNOSIS — I25119 Atherosclerotic heart disease of native coronary artery with unspecified angina pectoris: Secondary | ICD-10-CM | POA: Diagnosis not present

## 2015-01-12 NOTE — Progress Notes (Signed)
Patient ID: Alexander Hunter, male   DOB: [birth year], 64 y.o.   MRN: [id number]  Facility: [facility]       No Known Allergies  Chief Complaint  Patient presents with  . Discharge Note    Discharge from SNF    HPI:    Past Medical History  Diagnosis Date  . HLD (hyperlipidemia)   . Hernia   . Heme positive stool     Past Surgical History  Procedure Laterality Date  . Tonsillectomy and adenoidectomy  1961  . Fracture surgery      VITAL SIGNS There were no vitals taken for this visit.  Patient's Medications  New Prescriptions   No medications on file  Previous Medications   ACETAMINOPHEN (TYLENOL) 650 MG CR TABLET    Take 650 mg by mouth 3 (three) times daily. X 5 days, starting 01/10/15   ASPIRIN 325 MG TABLET    Take 325 mg by mouth daily.   DOCUSATE SODIUM (COLACE) 100 MG CAPSULE    Take 100 mg by mouth 2 (two) times daily. Start date 12/30/14 end 01/09/15   FAMOTIDINE (PEPCID) 20 MG TABLET    Take 20 mg by mouth 2 (two) times daily.   FERROUS SULFATE 325 (65 FE) MG TABLET    Take 325 mg by mouth 2 (two) times daily with a meal.   METOPROLOL TARTRATE (LOPRESSOR) 25 MG TABLET    Take 25 mg by mouth 2 (two) times daily. Start date 12/30/14 end 01/09/15   OXYCODONE (OXY IR/ROXICODONE) 5 MG IMMEDIATE RELEASE TABLET    Take 10 mg by mouth every 4 (four) hours as needed for severe pain. For pain 6-10   POLYETHYLENE GLYCOL (MIRALAX / GLYCOLAX) PACKET    Take 17 g by mouth daily.   PRAVASTATIN (PRAVACHOL) 40 MG TABLET    Take 40 mg by mouth at bedtime. Start date 12/30/14 end date 01/09/15  Modified Medications   No medications on file  Discontinued Medications   No medications on file     SIGNIFICANT DIAGNOSTIC EXAMS    Review of Systems    Physical Exam     ASSESSMENT/ PLAN:    Synthia Innocent NP [facility]  Contact [phone] Monday through Friday 8am- 5pm  After hours call [phone]

## 2015-01-12 NOTE — Progress Notes (Signed)
Patient ID: Alexander Hunter, male   DOB: [birth year], 64 y.o.   MRN: [id number]     Facility: [facility]      No Known Allergies  Chief Complaint  Patient presents with  . Discharge Note    Discharge from SNF    HPI:  He was hospitalized for for an MI and 3 vessel cabg. He was admitted to this facility for short term rehab. He is ready to be discharged to home.  He is being discharged to home with home health for pt/ot. He will not need dme. He will need his prescriptions to be written and will need to follow up with his pcp.   Past Medical History  Diagnosis Date  . HLD (hyperlipidemia)   . Hernia   . Heme positive stool     Past Surgical History  Procedure Laterality Date  . Tonsillectomy and adenoidectomy  1961  . Fracture surgery      VITAL SIGNS BP 132/76 mmHg  Pulse 84  Temp(Src) 97.8 F (36.6 C) (Oral)  Resp 20  Ht  (1.702 m)  Wt 145 lb (65.772 kg)  BMI 22.71 kg/m2  SpO2 98%  Patient's Medications  New Prescriptions   No medications on file  Previous Medications   ACETAMINOPHEN (TYLENOL) 650 MG CR TABLET    Take 650 mg by mouth 3 (three) times daily.    ASPIRIN 325 MG TABLET    Take 325 mg by mouth daily.   DOCUSATE SODIUM (COLACE) 100 MG CAPSULE    Take 100 mg by mouth 2 (two) times daily.   FAMOTIDINE (PEPCID) 20 MG TABLET    Take 20 mg by mouth 2 (two) times daily.   FERROUS SULFATE 325 (65 FE) MG TABLET    Take 325 mg by mouth 2 (two) times daily with a meal.   METOPROLOL TARTRATE (LOPRESSOR) 25 MG TABLET    Take 25 mg by mouth 2 (two) times daily.    OXYCODONE (OXY IR/ROXICODONE) 5 MG IMMEDIATE RELEASE TABLET    Take 5 or 10 mg every 4 hours as needed   POLYETHYLENE GLYCOL (MIRALAX / GLYCOLAX) PACKET    Take 17 g by mouth daily.   PRAVASTATIN (PRAVACHOL) 40 MG TABLET    Take 40 mg by mouth at bedtime.   Modified Medications   No medications on file  Discontinued Medications   No medications on file     SIGNIFICANT DIAGNOSTIC  EXAMS   LABS REVIEWED:   01-03-15; wbc 11.1; hgb 10.7; hct 32.3; mcv 86.4; plt 500; glucose 90; bun 22; creat 1.12; k+5.0; na++136 01-07-15: wbc 9.3; hgb 10.1; hvt 31.2; mcv 85.7; plt 525   Review of Systems  Constitutional: Negative for appetite change and fatigue.  HENT: Negative for congestion.   Respiratory: Negative for cough, chest tightness and shortness of breath.   Cardiovascular: Negative for chest pain, palpitations and leg swelling.  Gastrointestinal: Negative for nausea, abdominal pain, diarrhea and constipation.  Musculoskeletal: Negative for myalgias and arthralgias.  Skin: Negative for pallor.  Neurological: Negative for dizziness.  Psychiatric/Behavioral: The patient is not nervous/anxious.       Physical Exam  Constitutional: He is oriented to person, place, and time. No distress.  Eyes: Conjunctivae are normal.  Neck: Neck supple. No JVD present. No thyromegaly present.  Cardiovascular: Normal rate, regular rhythm and intact distal pulses.   Respiratory: Effort normal and breath sounds normal. No respiratory distress. He has no wheezes.  GI: Soft. Bowel sounds are normal. He  exhibits no distension. There is no tenderness.  Musculoskeletal: He exhibits no edema.  Able to move all extremities   Lymphadenopathy:    He has no cervical adenopathy.  Neurological: He is alert and oriented to person, place, and time.  Skin: Skin is warm and dry. He is not diaphoretic.  Psychiatric: He has a normal mood and affect.       ASSESSMENT/ PLAN:  Will discharge him to home with home health for pt/ot to evaluate and treat as indicated for endurance and adl retraining. He will not need dme. His prescriptions have been written for a 30 day supply of medications with #30 oxycodone tabs. Has an appointment with Myra Rude PA with [facility] vascular surgery on 01-19-15.    Time spent with patient 40   minutes >50% time spent counseling; reviewing medical record; tests;  labs; and developing future plan of care   Synthia Innocent NP [facility]  Contact [phone] Monday through Friday 8am- 5pm  After hours call [phone]

## 2015-02-03 ENCOUNTER — Encounter (HOSPITAL_COMMUNITY)
Admission: RE | Admit: 2015-02-03 | Discharge: 2015-02-03 | Disposition: A | Discharge status: Home / Self Care | Payer: 59 | Source: Ambulatory Visit | Attending: Cardiology | Admitting: Cardiology

## 2015-02-03 DIAGNOSIS — I213 ST elevation (STEMI) myocardial infarction of unspecified site: Secondary | ICD-10-CM | POA: Insufficient documentation

## 2015-02-03 DIAGNOSIS — Z951 Presence of aortocoronary bypass graft: Secondary | ICD-10-CM | POA: Insufficient documentation

## 2015-02-03 NOTE — Progress Notes (Signed)
Cardiac Rehab Medication Review by a Pharmacist  Does the patient  feel that his/her medications are working for him/her?  yes  Has the patient been experiencing any side effects to the medications prescribed?  no  Does the patient measure his/her own blood pressure or blood glucose at home?  no   Does the patient have any problems obtaining medications due to transportation or finances?   no  Understanding of regimen: good Understanding of indications: good Potential of compliance: fair    Pharmacist comments: There may be some issues getting all medications. Does not currently have a primary care providers.    Sherron Monday 02/03/2015 9:00 AM

## 2015-02-07 ENCOUNTER — Encounter (HOSPITAL_COMMUNITY)
Admission: RE | Admit: 2015-02-07 | Discharge: 2015-02-07 | Disposition: A | Discharge status: Home / Self Care | Payer: 59 | Source: Ambulatory Visit | Attending: Cardiology | Admitting: Cardiology

## 2015-02-07 DIAGNOSIS — I213 ST elevation (STEMI) myocardial infarction of unspecified site: Secondary | ICD-10-CM | POA: Diagnosis present

## 2015-02-07 DIAGNOSIS — Z951 Presence of aortocoronary bypass graft: Secondary | ICD-10-CM | POA: Diagnosis not present

## 2015-02-07 NOTE — Progress Notes (Signed)
Pt started cardiac rehab today.  Pt tolerated light exercise without difficulty. VSS, telemetry-Sr with occasional pvc, negative QRS, asymptomatic.  Medication list reconciled.  Pt verbalized compliance with medications and denies barriers to compliance. PSYCHOSOCIAL ASSESSMENT:  PHQ-0. Pt exhibits positive coping skills, hopeful outlook with supportive family. No psychosocial needs identified at this time, no psychosocial interventions necessary.    Pt enjoys golfing, swimming and tennis .   Pt cardiac rehab  goal is  to increase energy, and build confidence.  Pt encouraged to participate in home exercise and education classes  to increase ability to achieve these goals.   Pt long term cardiac rehab goal is to get back to normal by 90% and able to to light jogging.  Pt oriented to exercise equipment and routine.  Understanding verbalized.

## 2015-02-09 ENCOUNTER — Encounter (HOSPITAL_COMMUNITY)
Admission: RE | Admit: 2015-02-09 | Discharge: 2015-02-09 | Disposition: A | Discharge status: Home / Self Care | Payer: 59 | Source: Ambulatory Visit | Attending: Cardiology | Admitting: Cardiology

## 2015-02-09 DIAGNOSIS — I213 ST elevation (STEMI) myocardial infarction of unspecified site: Secondary | ICD-10-CM | POA: Diagnosis not present

## 2015-02-10 ENCOUNTER — Ambulatory Visit (HOSPITAL_COMMUNITY): Payer: Self-pay

## 2015-02-11 ENCOUNTER — Encounter (HOSPITAL_COMMUNITY)
Admission: RE | Admit: 2015-02-11 | Discharge: 2015-02-11 | Disposition: A | Discharge status: Home / Self Care | Payer: 59 | Source: Ambulatory Visit | Attending: Cardiology | Admitting: Cardiology

## 2015-02-11 DIAGNOSIS — I213 ST elevation (STEMI) myocardial infarction of unspecified site: Secondary | ICD-10-CM | POA: Diagnosis not present

## 2015-02-14 ENCOUNTER — Ambulatory Visit (HOSPITAL_COMMUNITY): Payer: Self-pay

## 2015-02-14 ENCOUNTER — Encounter (HOSPITAL_COMMUNITY)
Admission: RE | Admit: 2015-02-14 | Discharge: 2015-02-14 | Disposition: A | Discharge status: Home / Self Care | Payer: 59 | Source: Ambulatory Visit | Attending: Cardiology | Admitting: Cardiology

## 2015-02-14 DIAGNOSIS — I213 ST elevation (STEMI) myocardial infarction of unspecified site: Secondary | ICD-10-CM | POA: Diagnosis not present

## 2015-02-16 ENCOUNTER — Ambulatory Visit (HOSPITAL_COMMUNITY): Payer: Self-pay

## 2015-02-16 ENCOUNTER — Encounter (HOSPITAL_COMMUNITY)
Admission: RE | Admit: 2015-02-16 | Discharge: 2015-02-16 | Disposition: A | Discharge status: Home / Self Care | Payer: 59 | Source: Ambulatory Visit | Attending: Cardiology | Admitting: Cardiology

## 2015-02-16 DIAGNOSIS — I213 ST elevation (STEMI) myocardial infarction of unspecified site: Secondary | ICD-10-CM | POA: Diagnosis not present

## 2015-02-18 ENCOUNTER — Encounter (HOSPITAL_COMMUNITY)
Admission: RE | Admit: 2015-02-18 | Discharge: 2015-02-18 | Disposition: A | Discharge status: Home / Self Care | Payer: 59 | Source: Ambulatory Visit | Attending: Cardiology | Admitting: Cardiology

## 2015-02-18 ENCOUNTER — Ambulatory Visit (HOSPITAL_COMMUNITY): Payer: Self-pay

## 2015-02-18 DIAGNOSIS — I213 ST elevation (STEMI) myocardial infarction of unspecified site: Secondary | ICD-10-CM | POA: Diagnosis not present

## 2015-02-18 NOTE — Progress Notes (Signed)
Reviewed home exercise guidelines with patient including endpoints, temperature precautions, target heart rate and rate of perceived exertion. Pt plans to walk as his mode of home exercise. Pt voices understanding of instructions given. Keshia Weare M Ruvim Risko, MS, ACSM CCEP  

## 2015-02-21 ENCOUNTER — Ambulatory Visit (HOSPITAL_COMMUNITY): Payer: Self-pay

## 2015-02-21 ENCOUNTER — Encounter (HOSPITAL_COMMUNITY)
Admission: RE | Admit: 2015-02-21 | Discharge: 2015-02-21 | Disposition: A | Discharge status: Home / Self Care | Payer: 59 | Source: Ambulatory Visit | Attending: Cardiology | Admitting: Cardiology

## 2015-02-21 DIAGNOSIS — I213 ST elevation (STEMI) myocardial infarction of unspecified site: Secondary | ICD-10-CM | POA: Diagnosis not present

## 2015-02-23 ENCOUNTER — Ambulatory Visit (HOSPITAL_COMMUNITY): Payer: Self-pay

## 2015-02-23 ENCOUNTER — Encounter (HOSPITAL_COMMUNITY)
Admission: RE | Admit: 2015-02-23 | Discharge: 2015-02-23 | Disposition: A | Discharge status: Home / Self Care | Payer: 59 | Source: Ambulatory Visit | Attending: Cardiology | Admitting: Cardiology

## 2015-02-23 DIAGNOSIS — I213 ST elevation (STEMI) myocardial infarction of unspecified site: Secondary | ICD-10-CM | POA: Diagnosis not present

## 2015-02-25 ENCOUNTER — Encounter (HOSPITAL_COMMUNITY)
Admission: RE | Admit: 2015-02-25 | Discharge: 2015-02-25 | Disposition: A | Discharge status: Home / Self Care | Payer: 59 | Source: Ambulatory Visit | Attending: Cardiology | Admitting: Cardiology

## 2015-02-25 ENCOUNTER — Ambulatory Visit (HOSPITAL_COMMUNITY): Payer: Self-pay

## 2015-02-25 DIAGNOSIS — Z951 Presence of aortocoronary bypass graft: Secondary | ICD-10-CM | POA: Insufficient documentation

## 2015-02-25 DIAGNOSIS — I213 ST elevation (STEMI) myocardial infarction of unspecified site: Secondary | ICD-10-CM | POA: Insufficient documentation

## 2015-02-25 NOTE — Progress Notes (Signed)
Alexander Hunter 64 y.o. male Nutrition Note Spoke with pt.  Nutrition Survey reviewed with pt. Pt is following Step 1 of the Therapeutic Lifestyle Changes diet. Pt reports losing 16 lb from his UBW of 161 lbs after surgery. Pt has liberalized his diet to help promote wt gain. Pt wt today reportedly 72.5 kg, which is up 2 kg since admission.  Pt states he is now at the point where he can focus less on gaining weight "because my clothes aren't falling off of me now." Pt expressed understanding of the information reviewed. Pt aware of nutrition education classes offered and plans on attending nutrition classes. No results found for: HGBA1C Wt Readings from Last 3 Encounters:  02/03/15 155 lb 6.8 oz (70.5 kg)  01/12/15 145 lb (65.772 kg)  01/03/15 144 lb (65.318 kg)   Nutrition Diagnosis ? Food-and nutrition-related knowledge deficit related to lack of exposure to information as related to diagnosis of: ? CVD  Nutrition Intervention ? Benefits of adopting Therapeutic Lifestyle Changes discussed when Medficts reviewed. ? Pt to attend the Portion Distortion class ? Pt to attend the  ? Nutrition I class                        ? Nutrition II class ? Continue client-centered nutrition education by RD, as part of interdisciplinary care.  Goal(s) ? Pt to identify and limit food sources of saturated fat, trans fat, and cholesterol  Monitor and Evaluate progress toward nutrition goal with team.  [name], M.Ed, RD, LDN, CDE 02/25/2015 12:11 PM

## 2015-03-02 ENCOUNTER — Ambulatory Visit (HOSPITAL_COMMUNITY): Payer: Self-pay

## 2015-03-02 ENCOUNTER — Encounter (HOSPITAL_COMMUNITY)
Admission: RE | Admit: 2015-03-02 | Discharge: 2015-03-02 | Disposition: A | Discharge status: Home / Self Care | Payer: 59 | Source: Ambulatory Visit | Attending: Cardiology | Admitting: Cardiology

## 2015-03-02 DIAGNOSIS — I213 ST elevation (STEMI) myocardial infarction of unspecified site: Secondary | ICD-10-CM | POA: Diagnosis not present

## 2015-03-04 ENCOUNTER — Ambulatory Visit (HOSPITAL_COMMUNITY): Payer: Self-pay

## 2015-03-04 ENCOUNTER — Encounter (HOSPITAL_COMMUNITY)
Admission: RE | Admit: 2015-03-04 | Discharge: 2015-03-04 | Disposition: A | Discharge status: Home / Self Care | Payer: 59 | Source: Ambulatory Visit | Attending: Cardiology | Admitting: Cardiology

## 2015-03-04 DIAGNOSIS — I213 ST elevation (STEMI) myocardial infarction of unspecified site: Secondary | ICD-10-CM | POA: Diagnosis not present

## 2015-03-07 ENCOUNTER — Ambulatory Visit (HOSPITAL_COMMUNITY): Payer: Self-pay

## 2015-03-07 ENCOUNTER — Encounter (HOSPITAL_COMMUNITY)
Admission: RE | Admit: 2015-03-07 | Discharge: 2015-03-07 | Disposition: A | Discharge status: Home / Self Care | Payer: 59 | Source: Ambulatory Visit | Attending: Cardiology | Admitting: Cardiology

## 2015-03-07 DIAGNOSIS — I213 ST elevation (STEMI) myocardial infarction of unspecified site: Secondary | ICD-10-CM | POA: Diagnosis not present

## 2015-03-09 ENCOUNTER — Encounter (HOSPITAL_COMMUNITY)
Admission: RE | Admit: 2015-03-09 | Discharge: 2015-03-09 | Disposition: A | Discharge status: Home / Self Care | Payer: 59 | Source: Ambulatory Visit | Attending: Cardiology | Admitting: Cardiology

## 2015-03-09 ENCOUNTER — Ambulatory Visit (HOSPITAL_COMMUNITY): Payer: Self-pay

## 2015-03-09 DIAGNOSIS — I213 ST elevation (STEMI) myocardial infarction of unspecified site: Secondary | ICD-10-CM | POA: Diagnosis not present

## 2015-03-11 ENCOUNTER — Ambulatory Visit (INDEPENDENT_AMBULATORY_CARE_PROVIDER_SITE_OTHER): Payer: 59 | Admitting: Family Medicine

## 2015-03-11 ENCOUNTER — Ambulatory Visit (INDEPENDENT_AMBULATORY_CARE_PROVIDER_SITE_OTHER): Payer: 59

## 2015-03-11 ENCOUNTER — Ambulatory Visit (HOSPITAL_COMMUNITY): Payer: Self-pay

## 2015-03-11 ENCOUNTER — Encounter (HOSPITAL_COMMUNITY)
Admission: RE | Admit: 2015-03-11 | Discharge: 2015-03-11 | Disposition: A | Discharge status: Home / Self Care | Payer: 59 | Source: Ambulatory Visit | Attending: Cardiology | Admitting: Cardiology

## 2015-03-11 VITALS — BP 130/72 | HR 93 | Temp 98.2°F | Resp 16 | Ht 67.0 in | Wt 162.2 lb

## 2015-03-11 DIAGNOSIS — K4020 Bilateral inguinal hernia, without obstruction or gangrene, not specified as recurrent: Secondary | ICD-10-CM

## 2015-03-11 DIAGNOSIS — Z951 Presence of aortocoronary bypass graft: Secondary | ICD-10-CM

## 2015-03-11 DIAGNOSIS — I213 ST elevation (STEMI) myocardial infarction of unspecified site: Secondary | ICD-10-CM | POA: Diagnosis not present

## 2015-03-11 DIAGNOSIS — J9811 Atelectasis: Secondary | ICD-10-CM

## 2015-03-11 DIAGNOSIS — I252 Old myocardial infarction: Secondary | ICD-10-CM | POA: Diagnosis not present

## 2015-03-11 LAB — LIPID PANEL
CHOL/HDL RATIO: 2.2 ratio (ref <=5.0)
CHOLESTEROL: 197 mg/dL (ref 125–200)
HDL: 91 mg/dL (ref >=40)
LDL CALC: 96 mg/dL (ref <130)
TRIGLYCERIDES: 52 mg/dL (ref <150)
VLDL: 10 mg/dL (ref <30)

## 2015-03-11 LAB — POCT CBC
GRANULOCYTE PERCENT: 57.5 % (ref 37–80)
HCT, POC: 43.4 % — AB (ref 43.5–53.7)
Hemoglobin: 13.3 g/dL — AB (ref 14.1–18.1)
Lymph, poc: 2.7 (ref 0.6–3.4)
MCH, POC: 26.7 pg — AB (ref 27–31.2)
MCHC: 30.7 g/dL — AB (ref 31.8–35.4)
MCV: 87.1 fL (ref 80–97)
MID (CBC): 0.4 (ref 0–0.9)
MPV: 8.8 fL (ref 0–99.8)
PLATELET COUNT, POC: 185 10*3/uL (ref 142–424)
POC Granulocyte: 4.2 (ref 2–6.9)
POC LYMPH PERCENT: 37.3 %L (ref 10–50)
POC MID %: 5.2 %M (ref 0–12)
RBC: 4.98 M/uL (ref 4.69–6.13)
RDW, POC: 19.2 %
WBC: 7.3 10*3/uL (ref 4.6–10.2)

## 2015-03-11 LAB — COMPLETE METABOLIC PANEL WITH GFR
ALT: 23 U/L (ref 9–46)
AST: 23 U/L (ref 10–35)
Albumin: 4 g/dL (ref 3.6–5.1)
Alkaline Phosphatase: 69 U/L (ref 40–115)
BUN: 18 mg/dL (ref 7–25)
CHLORIDE: 103 mmol/L (ref 98–110)
CO2: 27 mmol/L (ref 20–31)
CREATININE: 0.93 mg/dL (ref 0.70–1.25)
Calcium: 9.6 mg/dL (ref 8.6–10.3)
GFR, Est African American: >89 mL/min (ref >=60)
GFR, Est Non African American: 88 mL/min (ref >=60)
GLUCOSE: 76 mg/dL (ref 65–99)
POTASSIUM: 4 mmol/L (ref 3.5–5.3)
SODIUM: 139 mmol/L (ref 135–146)
Total Bilirubin: 0.5 mg/dL (ref 0.2–1.2)
Total Protein: 7.1 g/dL (ref 6.1–8.1)

## 2015-03-11 NOTE — Progress Notes (Signed)
Patient ID: Alexander Hunter, male    DOB: 1951-02-26  Age: 64 y.o. MRN: [id number]  Chief Complaint  Patient presents with  . Hernia    pt. wants info. on hernia and hernia repair     Subjective:   Patient is here to be seen for a little hernias. He has a long history of having them and they bother him more than the use to he would like to go ahead and get them they bother him more than they used to he would like to go ahead and get them repaired a always reducible, never gives severe pain. He wanted to find out what surgery would entail. He used to smoke but no longer does. Earlier this summer he had a heart attack. He was taken from work to [facility]. He is still in cardiac rehabilitation, he does not see his cardiologist back into all 6 months from now in [place]. He is doing well in the cardiac rehabilitation.  HEENT unremarkable Constitutional unremarkable Respiratory: Unremarkable Cardiovascular: No chest pains or problems. GI: Unremarkable GU: Unremarkable Musculoskeletal: Still is a little weak but unremarkable. Neurologic: Unremarkable Psychiatric: Doing well. He has had all this well.   Current allergies, medications, problem list, past/family and social histories reviewed.  Objective:  BP 130/72 mmHg  Pulse 93  Temp(Src) 98.2 F (36.8 C) (Oral)  Resp 16  Ht 5\' 7"  (1.702 m)  Wt 162 lb 3.2 oz (73.573 kg)  BMI 25.40 kg/m2  SpO2 97% Chest clear. Heart regular without murmurs. Abdomen soft without masses. He has a very large left inguinal hernia, the size of a grapefruit. The right side has a hernia about the size of a tennis ball, looks miniscule in comparison to the left. Both are reducible when he lays down. Testes normal.  [facility] reading (PRIMARY) by  Dr. Alwyn Ren  Normal chest x-ray except mild area of atelectasis left lower lobe, persistent since surgery by description in a chest x-ray from  [facility].. CABG wires   Assessment & Plan:   Assessment: 1.  Bilateral inguinal hernia without obstruction or gangrene, recurrence not specified   2. History of MI (myocardial infarction)   3. Atelectasis   4. History of heart bypass surgery       Plan: Orders Placed This Encounter  Procedures  . DG Chest 2 View    Order Specific Question:  Reason for Exam (SYMPTOM  OR DIAGNOSIS REQUIRED)    Answer:  atalectasis    Order Specific Question:  Preferred imaging location?    Answer:  External  . COMPLETE METABOLIC PANEL WITH GFR  . Lipid panel  . Ambulatory referral to General Surgery    Referral Priority:  Routine    Referral Type:  Surgical    Referral Reason:  Specialty Services Required    Requested Specialty:  General Surgery    Number of Visits Requested:  1  . POCT CBC  . EKG 12-Lead    Refer to general surgery for evaluation of the hernias  Contact his cardiologist to see when he can safely have surgery    Patient Instructions  Continue your current medications  I will let you know the results of your labs from today. The EKG looks good except for the old scar left from the heart attack. The chest x-ray looks good with the exception of a tiny streak of scarring in the left lung from surgery.  Referral is being made to a general surgeon.  After you have seen the  surgeon, you should contact your cardiologist office and ask him when it would be safe for you to have the surgery. It is my opinion that you should wait until you finish cardiac rehabilitation and probably have had a repeat stress test by your cardiologist before actually having any surgery.  Plan to return here in about 3 months for a recheck, or let me see you a couple of weeks before anticipated surgery.     Return in about 3 months (around 06/10/2015).   HOPPER,DAVID, MD 03/11/2015

## 2015-03-11 NOTE — Patient Instructions (Addendum)
Continue your current medications  I will let you know the results of your labs from today. The EKG looks good except for the old scar left from the heart attack. The chest x-ray looks good with the exception of a tiny streak of scarring in the left lung from surgery.  Referral is being made to a general surgeon.  After you have seen the surgeon, you should contact your cardiologist office and ask him when it would be safe for you to have the surgery. It is my opinion that you should wait until you finish cardiac rehabilitation and probably have had a repeat stress test by your cardiologist before actually having any surgery.  Plan to return here in about 3 months for a recheck, or let me see you a couple of weeks before anticipated surgery.

## 2015-03-14 ENCOUNTER — Encounter (HOSPITAL_COMMUNITY)
Admission: RE | Admit: 2015-03-14 | Discharge: 2015-03-14 | Disposition: A | Discharge status: Home / Self Care | Payer: 59 | Source: Ambulatory Visit | Attending: Cardiology | Admitting: Cardiology

## 2015-03-14 ENCOUNTER — Ambulatory Visit (HOSPITAL_COMMUNITY): Payer: Self-pay

## 2015-03-14 DIAGNOSIS — I213 ST elevation (STEMI) myocardial infarction of unspecified site: Secondary | ICD-10-CM | POA: Diagnosis not present

## 2015-03-16 ENCOUNTER — Encounter (HOSPITAL_COMMUNITY)
Admission: RE | Admit: 2015-03-16 | Discharge: 2015-03-16 | Disposition: A | Discharge status: Home / Self Care | Payer: 59 | Source: Ambulatory Visit | Attending: Cardiology | Admitting: Cardiology

## 2015-03-16 ENCOUNTER — Telehealth: Payer: Self-pay

## 2015-03-16 ENCOUNTER — Ambulatory Visit (HOSPITAL_COMMUNITY): Payer: Self-pay

## 2015-03-16 DIAGNOSIS — I213 ST elevation (STEMI) myocardial infarction of unspecified site: Secondary | ICD-10-CM | POA: Diagnosis not present

## 2015-03-16 NOTE — Telephone Encounter (Signed)
Pt dropped form to be filled out for Dr Alwyn Ren and faxed to [phone]. Thank you form was placed in the box at the TL station

## 2015-03-16 NOTE — Telephone Encounter (Signed)
Forms were filled out and faxed over for patient.

## 2015-03-18 ENCOUNTER — Encounter (HOSPITAL_COMMUNITY)
Admission: RE | Admit: 2015-03-18 | Discharge: 2015-03-18 | Disposition: A | Discharge status: Home / Self Care | Payer: 59 | Source: Ambulatory Visit | Attending: Cardiology | Admitting: Cardiology

## 2015-03-18 ENCOUNTER — Ambulatory Visit (HOSPITAL_COMMUNITY): Payer: Self-pay

## 2015-03-18 DIAGNOSIS — I213 ST elevation (STEMI) myocardial infarction of unspecified site: Secondary | ICD-10-CM | POA: Diagnosis not present

## 2015-03-21 ENCOUNTER — Ambulatory Visit (HOSPITAL_COMMUNITY): Payer: Self-pay

## 2015-03-21 ENCOUNTER — Encounter (HOSPITAL_COMMUNITY)
Admission: RE | Admit: 2015-03-21 | Discharge: 2015-03-21 | Disposition: A | Discharge status: Home / Self Care | Payer: 59 | Source: Ambulatory Visit | Attending: Cardiology | Admitting: Cardiology

## 2015-03-21 DIAGNOSIS — I213 ST elevation (STEMI) myocardial infarction of unspecified site: Secondary | ICD-10-CM | POA: Diagnosis not present

## 2015-03-23 ENCOUNTER — Ambulatory Visit: Payer: Self-pay | Admitting: General Surgery

## 2015-03-23 ENCOUNTER — Ambulatory Visit (HOSPITAL_COMMUNITY): Payer: Self-pay

## 2015-03-23 ENCOUNTER — Encounter (HOSPITAL_COMMUNITY)
Admission: RE | Admit: 2015-03-23 | Discharge: 2015-03-23 | Disposition: A | Discharge status: Home / Self Care | Payer: 59 | Source: Ambulatory Visit | Attending: Cardiology | Admitting: Cardiology

## 2015-03-23 DIAGNOSIS — I213 ST elevation (STEMI) myocardial infarction of unspecified site: Secondary | ICD-10-CM | POA: Diagnosis not present

## 2015-03-25 ENCOUNTER — Encounter (HOSPITAL_COMMUNITY)
Admission: RE | Admit: 2015-03-25 | Discharge: 2015-03-25 | Disposition: A | Discharge status: Home / Self Care | Payer: 59 | Source: Ambulatory Visit | Attending: Cardiology | Admitting: Cardiology

## 2015-03-25 ENCOUNTER — Ambulatory Visit (HOSPITAL_COMMUNITY): Payer: Self-pay

## 2015-03-25 DIAGNOSIS — I213 ST elevation (STEMI) myocardial infarction of unspecified site: Secondary | ICD-10-CM | POA: Diagnosis not present

## 2015-03-28 ENCOUNTER — Encounter (HOSPITAL_COMMUNITY)
Admission: RE | Admit: 2015-03-28 | Discharge: 2015-03-28 | Disposition: A | Discharge status: Home / Self Care | Payer: 59 | Source: Ambulatory Visit | Attending: Cardiology | Admitting: Cardiology

## 2015-03-28 ENCOUNTER — Ambulatory Visit (HOSPITAL_COMMUNITY): Payer: Self-pay

## 2015-03-28 DIAGNOSIS — Z951 Presence of aortocoronary bypass graft: Secondary | ICD-10-CM | POA: Diagnosis present

## 2015-03-28 DIAGNOSIS — I213 ST elevation (STEMI) myocardial infarction of unspecified site: Secondary | ICD-10-CM | POA: Insufficient documentation

## 2015-03-30 ENCOUNTER — Encounter (HOSPITAL_COMMUNITY)
Admission: RE | Admit: 2015-03-30 | Discharge: 2015-03-30 | Disposition: A | Discharge status: Home / Self Care | Payer: 59 | Source: Ambulatory Visit | Attending: Cardiology | Admitting: Cardiology

## 2015-03-30 ENCOUNTER — Ambulatory Visit (HOSPITAL_COMMUNITY): Payer: Self-pay

## 2015-03-30 DIAGNOSIS — I213 ST elevation (STEMI) myocardial infarction of unspecified site: Secondary | ICD-10-CM | POA: Diagnosis not present

## 2015-04-01 ENCOUNTER — Ambulatory Visit (HOSPITAL_COMMUNITY): Payer: Self-pay

## 2015-04-01 ENCOUNTER — Encounter (HOSPITAL_COMMUNITY)
Admission: RE | Admit: 2015-04-01 | Discharge: 2015-04-01 | Disposition: A | Discharge status: Home / Self Care | Payer: 59 | Source: Ambulatory Visit | Attending: Cardiology | Admitting: Cardiology

## 2015-04-01 DIAGNOSIS — I213 ST elevation (STEMI) myocardial infarction of unspecified site: Secondary | ICD-10-CM | POA: Diagnosis not present

## 2015-04-04 ENCOUNTER — Encounter (HOSPITAL_COMMUNITY)
Admission: RE | Admit: 2015-04-04 | Discharge: 2015-04-04 | Disposition: A | Discharge status: Home / Self Care | Payer: 59 | Source: Ambulatory Visit | Attending: Cardiology | Admitting: Cardiology

## 2015-04-04 ENCOUNTER — Ambulatory Visit (HOSPITAL_COMMUNITY): Payer: Self-pay

## 2015-04-04 DIAGNOSIS — I213 ST elevation (STEMI) myocardial infarction of unspecified site: Secondary | ICD-10-CM | POA: Diagnosis not present

## 2015-04-06 ENCOUNTER — Encounter (HOSPITAL_COMMUNITY)
Admission: RE | Admit: 2015-04-06 | Discharge: 2015-04-06 | Disposition: A | Discharge status: Home / Self Care | Payer: 59 | Source: Ambulatory Visit | Attending: Cardiology | Admitting: Cardiology

## 2015-04-06 ENCOUNTER — Ambulatory Visit (HOSPITAL_COMMUNITY): Payer: Self-pay

## 2015-04-06 DIAGNOSIS — I213 ST elevation (STEMI) myocardial infarction of unspecified site: Secondary | ICD-10-CM | POA: Diagnosis not present

## 2015-04-08 ENCOUNTER — Encounter (HOSPITAL_COMMUNITY)
Admission: RE | Admit: 2015-04-08 | Discharge: 2015-04-08 | Disposition: A | Discharge status: Home / Self Care | Payer: 59 | Source: Ambulatory Visit | Attending: Cardiology | Admitting: Cardiology

## 2015-04-08 ENCOUNTER — Ambulatory Visit (HOSPITAL_COMMUNITY): Payer: Self-pay

## 2015-04-08 DIAGNOSIS — I213 ST elevation (STEMI) myocardial infarction of unspecified site: Secondary | ICD-10-CM | POA: Diagnosis not present

## 2015-04-11 ENCOUNTER — Encounter (HOSPITAL_COMMUNITY): Payer: 59

## 2015-04-11 ENCOUNTER — Ambulatory Visit (HOSPITAL_COMMUNITY): Payer: Self-pay

## 2015-04-13 ENCOUNTER — Encounter (HOSPITAL_COMMUNITY)
Admission: RE | Admit: 2015-04-13 | Discharge: 2015-04-13 | Disposition: A | Discharge status: Home / Self Care | Payer: 59 | Source: Ambulatory Visit | Attending: Cardiology | Admitting: Cardiology

## 2015-04-13 ENCOUNTER — Ambulatory Visit (HOSPITAL_COMMUNITY): Payer: Self-pay

## 2015-04-13 DIAGNOSIS — I213 ST elevation (STEMI) myocardial infarction of unspecified site: Secondary | ICD-10-CM | POA: Diagnosis not present

## 2015-04-15 ENCOUNTER — Ambulatory Visit (HOSPITAL_COMMUNITY): Payer: Self-pay

## 2015-04-15 ENCOUNTER — Encounter (HOSPITAL_COMMUNITY): Payer: 59

## 2015-04-15 ENCOUNTER — Telehealth (HOSPITAL_COMMUNITY): Payer: Self-pay

## 2015-04-18 ENCOUNTER — Ambulatory Visit (HOSPITAL_COMMUNITY): Payer: Self-pay

## 2015-04-18 ENCOUNTER — Encounter (HOSPITAL_COMMUNITY)
Admission: RE | Admit: 2015-04-18 | Discharge: 2015-04-18 | Disposition: A | Discharge status: Home / Self Care | Payer: 59 | Source: Ambulatory Visit | Attending: Cardiology | Admitting: Cardiology

## 2015-04-18 DIAGNOSIS — I213 ST elevation (STEMI) myocardial infarction of unspecified site: Secondary | ICD-10-CM | POA: Diagnosis not present

## 2015-04-20 ENCOUNTER — Ambulatory Visit (HOSPITAL_COMMUNITY): Payer: Self-pay

## 2015-04-20 ENCOUNTER — Encounter (HOSPITAL_COMMUNITY)
Admission: RE | Admit: 2015-04-20 | Discharge: 2015-04-20 | Disposition: A | Discharge status: Home / Self Care | Payer: 59 | Source: Ambulatory Visit | Attending: Cardiology | Admitting: Cardiology

## 2015-04-20 DIAGNOSIS — I213 ST elevation (STEMI) myocardial infarction of unspecified site: Secondary | ICD-10-CM | POA: Diagnosis not present

## 2015-04-22 ENCOUNTER — Encounter (HOSPITAL_COMMUNITY): Payer: 59

## 2015-04-22 ENCOUNTER — Ambulatory Visit (HOSPITAL_COMMUNITY): Payer: Self-pay

## 2015-04-25 ENCOUNTER — Encounter (HOSPITAL_COMMUNITY)
Admission: RE | Admit: 2015-04-25 | Discharge: 2015-04-25 | Disposition: A | Discharge status: Home / Self Care | Payer: 59 | Source: Ambulatory Visit | Attending: Cardiology | Admitting: Cardiology

## 2015-04-25 ENCOUNTER — Ambulatory Visit (HOSPITAL_COMMUNITY): Payer: Self-pay

## 2015-04-25 DIAGNOSIS — I213 ST elevation (STEMI) myocardial infarction of unspecified site: Secondary | ICD-10-CM | POA: Diagnosis not present

## 2015-04-27 ENCOUNTER — Encounter (HOSPITAL_COMMUNITY)
Admission: RE | Admit: 2015-04-27 | Discharge: 2015-04-27 | Disposition: A | Discharge status: Home / Self Care | Payer: 59 | Source: Ambulatory Visit | Attending: Cardiology | Admitting: Cardiology

## 2015-04-27 ENCOUNTER — Ambulatory Visit (HOSPITAL_COMMUNITY): Payer: Self-pay

## 2015-04-27 DIAGNOSIS — Z951 Presence of aortocoronary bypass graft: Secondary | ICD-10-CM | POA: Diagnosis present

## 2015-04-27 DIAGNOSIS — I213 ST elevation (STEMI) myocardial infarction of unspecified site: Secondary | ICD-10-CM | POA: Diagnosis present

## 2015-04-29 ENCOUNTER — Encounter (HOSPITAL_COMMUNITY)
Admission: RE | Admit: 2015-04-29 | Discharge: 2015-04-29 | Disposition: A | Discharge status: Home / Self Care | Payer: 59 | Source: Ambulatory Visit | Attending: Cardiology | Admitting: Cardiology

## 2015-04-29 ENCOUNTER — Ambulatory Visit (HOSPITAL_COMMUNITY): Payer: Self-pay

## 2015-04-29 DIAGNOSIS — I213 ST elevation (STEMI) myocardial infarction of unspecified site: Secondary | ICD-10-CM | POA: Diagnosis not present

## 2015-05-02 ENCOUNTER — Encounter (HOSPITAL_COMMUNITY)
Admission: RE | Admit: 2015-05-02 | Discharge: 2015-05-02 | Disposition: A | Discharge status: Home / Self Care | Payer: 59 | Source: Ambulatory Visit | Attending: Cardiology | Admitting: Cardiology

## 2015-05-02 ENCOUNTER — Ambulatory Visit (HOSPITAL_COMMUNITY): Payer: Self-pay

## 2015-05-02 DIAGNOSIS — I213 ST elevation (STEMI) myocardial infarction of unspecified site: Secondary | ICD-10-CM | POA: Diagnosis not present

## 2015-05-04 ENCOUNTER — Ambulatory Visit (HOSPITAL_COMMUNITY): Payer: Self-pay

## 2015-05-04 ENCOUNTER — Encounter (HOSPITAL_COMMUNITY)
Admission: RE | Admit: 2015-05-04 | Discharge: 2015-05-04 | Disposition: A | Discharge status: Home / Self Care | Payer: 59 | Source: Ambulatory Visit | Attending: Cardiology | Admitting: Cardiology

## 2015-05-04 DIAGNOSIS — I213 ST elevation (STEMI) myocardial infarction of unspecified site: Secondary | ICD-10-CM | POA: Diagnosis not present

## 2015-05-06 ENCOUNTER — Ambulatory Visit (HOSPITAL_COMMUNITY): Payer: Self-pay

## 2015-05-06 ENCOUNTER — Encounter (HOSPITAL_COMMUNITY)
Admission: RE | Admit: 2015-05-06 | Discharge: 2015-05-06 | Disposition: A | Discharge status: Home / Self Care | Payer: 59 | Source: Ambulatory Visit | Attending: Cardiology | Admitting: Cardiology

## 2015-05-06 DIAGNOSIS — I213 ST elevation (STEMI) myocardial infarction of unspecified site: Secondary | ICD-10-CM | POA: Diagnosis not present

## 2015-05-09 ENCOUNTER — Ambulatory Visit (HOSPITAL_COMMUNITY): Payer: Self-pay

## 2015-05-09 ENCOUNTER — Encounter (HOSPITAL_COMMUNITY)
Admission: RE | Admit: 2015-05-09 | Discharge: 2015-05-09 | Disposition: A | Discharge status: Home / Self Care | Payer: 59 | Source: Ambulatory Visit | Attending: Cardiology | Admitting: Cardiology

## 2015-05-09 DIAGNOSIS — I213 ST elevation (STEMI) myocardial infarction of unspecified site: Secondary | ICD-10-CM | POA: Diagnosis not present

## 2015-05-09 NOTE — Progress Notes (Signed)
Pt graduated from cardiac rehab program today with completion of 36 exercise sessions in Phase II. Pt maintained good attendance and progressed nicely during his participation in rehab as evidenced by increased MET level. Pt increased his MET level from 3.9 to 4.3   Medication list reconciled. Repeat  PHQ score-0  .  Pt has made significant lifestyle changes and should be commended for his success. Pt feels he has achieved his goals during cardiac rehab. Pt feels his energy level has increased.  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  Pt feels he is able to do light jogging, better health condition and and back to normal.  Pt is able to continue to do those activities at home without any issues.   Pt plans to continue exercise in cardiac maintenance program. Maurice Small RN, BSN

## 2015-05-11 ENCOUNTER — Ambulatory Visit (HOSPITAL_COMMUNITY): Payer: Self-pay

## 2015-05-11 ENCOUNTER — Encounter (HOSPITAL_COMMUNITY): Payer: 59

## 2015-05-13 ENCOUNTER — Encounter (HOSPITAL_COMMUNITY): Payer: 59

## 2015-05-13 ENCOUNTER — Ambulatory Visit (HOSPITAL_COMMUNITY): Payer: Self-pay

## 2015-05-16 ENCOUNTER — Ambulatory Visit (HOSPITAL_COMMUNITY): Payer: Self-pay

## 2015-05-18 ENCOUNTER — Ambulatory Visit (HOSPITAL_COMMUNITY): Payer: Self-pay

## 2015-05-20 ENCOUNTER — Ambulatory Visit (HOSPITAL_COMMUNITY): Payer: Self-pay

## 2015-05-25 NOTE — Pre-Procedure Instructions (Signed)
Alexander Hunter  05/25/2015      [facility] - [place],  - [address] [place] [address] Phone: [phone] Fax: [phone]    Your procedure is scheduled on Wed, Dec 7 @ 8:30 AM  Report to [facility] Admitting at 6:30 AM  Call this number if you have problems the morning of surgery:  [phone]   Remember:  Do not eat food or drink liquids after midnight.              Stop taking your Aspirin. No Goody's,BC's,Aleve,Ibuprofen,Advil,Motrin,Fish Oil,or any Herbal Medications.    Do not wear jewelry.  Do not wear lotions, powders, or colognes.  You may wear deodorant.  Men may shave face and neck.  Do not bring valuables to the hospital.  [facility] is not responsible for any belongings or valuables.  Contacts, dentures or bridgework may not be worn into surgery.  Leave your suitcase in the car.  After surgery it may be brought to your room.  For patients admitted to the hospital, discharge time will be determined by your treatment team.  Patients discharged the day of surgery will not be allowed to drive home.    Special instructions:  [facility] - Preparing for Surgery  Before surgery, you can play an important role.  Because skin is not sterile, your skin needs to be as free of germs as possible.  You can reduce the number of germs on you skin by washing with CHG (chlorahexidine gluconate) soap before surgery.  CHG is an antiseptic cleaner which kills germs and bonds with the skin to continue killing germs even after washing.  Please DO NOT use if you have an allergy to CHG or antibacterial soaps.  If your skin becomes reddened/irritated stop using the CHG and inform your nurse when you arrive at Short Stay.  Do not shave (including legs and underarms) for at least 48 hours prior to the first CHG shower.  You may shave your face.  Please follow these instructions carefully:   1.  Shower with CHG Soap  the night before surgery and the                                morning of Surgery.  2.  If you choose to wash your hair, wash your hair first as usual with your       normal shampoo.  3.  After you shampoo, rinse your hair and body thoroughly to remove the                      Shampoo.  4.  Use CHG as you would any other liquid soap.  You can apply chg directly       to the skin and wash gently with scrungie or a clean washcloth.  5.  Apply the CHG Soap to your body ONLY FROM THE NECK DOWN.        Do not use on open wounds or open sores.  Avoid contact with your eyes,       ears, mouth and genitals (private parts).  Wash genitals (private parts)       with your normal soap.  6.  Wash thoroughly, paying special attention to the area where your surgery        will be performed.  7.  Thoroughly rinse your body  with warm water from the neck down.  8.  DO NOT shower/wash with your normal soap after using and rinsing off       the CHG Soap.  9.  Pat yourself dry with a clean towel.            10.  Wear clean pajamas.            11.  Place clean sheets on your bed the night of your first shower and do not        sleep with pets.  Day of Surgery  Do not apply any lotions/deoderants the morning of surgery.  Please wear clean clothes to the hospital/surgery center.    Please read over the following fact sheets that you were given. Pain Booklet, Coughing and Deep Breathing and Surgical Site Infection Prevention

## 2015-05-26 ENCOUNTER — Encounter (HOSPITAL_COMMUNITY)
Admission: RE | Admit: 2015-05-26 | Discharge: 2015-05-26 | Disposition: A | Discharge status: Home / Self Care | Payer: 59 | Source: Ambulatory Visit | Attending: Interventional Cardiology | Admitting: Interventional Cardiology

## 2015-05-26 ENCOUNTER — Encounter (HOSPITAL_COMMUNITY): Payer: Self-pay

## 2015-05-26 DIAGNOSIS — Z01812 Encounter for preprocedural laboratory examination: Secondary | ICD-10-CM | POA: Diagnosis not present

## 2015-05-26 DIAGNOSIS — I2510 Atherosclerotic heart disease of native coronary artery without angina pectoris: Secondary | ICD-10-CM | POA: Insufficient documentation

## 2015-05-26 DIAGNOSIS — K4020 Bilateral inguinal hernia, without obstruction or gangrene, not specified as recurrent: Secondary | ICD-10-CM | POA: Diagnosis not present

## 2015-05-26 DIAGNOSIS — I252 Old myocardial infarction: Secondary | ICD-10-CM | POA: Diagnosis not present

## 2015-05-26 DIAGNOSIS — Z79899 Other long term (current) drug therapy: Secondary | ICD-10-CM | POA: Insufficient documentation

## 2015-05-26 DIAGNOSIS — Z01818 Encounter for other preprocedural examination: Secondary | ICD-10-CM | POA: Insufficient documentation

## 2015-05-26 DIAGNOSIS — Z951 Presence of aortocoronary bypass graft: Secondary | ICD-10-CM | POA: Insufficient documentation

## 2015-05-26 DIAGNOSIS — Z7982 Long term (current) use of aspirin: Secondary | ICD-10-CM | POA: Diagnosis not present

## 2015-05-26 DIAGNOSIS — Z87891 Personal history of nicotine dependence: Secondary | ICD-10-CM | POA: Insufficient documentation

## 2015-05-26 DIAGNOSIS — E785 Hyperlipidemia, unspecified: Secondary | ICD-10-CM | POA: Diagnosis not present

## 2015-05-26 HISTORY — DX: Pneumonia, unspecified organism: J18.9

## 2015-05-26 HISTORY — DX: Acute myocardial infarction, unspecified: I21.9

## 2015-05-26 HISTORY — DX: Reserved for inherently not codable concepts without codable children: IMO0001

## 2015-05-26 HISTORY — DX: Dorsalgia, unspecified: M54.9

## 2015-05-26 HISTORY — DX: Personal history of other medical treatment: Z92.89

## 2015-05-26 HISTORY — DX: Atherosclerotic heart disease of native coronary artery without angina pectoris: I25.10

## 2015-05-26 HISTORY — DX: Insomnia, unspecified: G47.00

## 2015-05-26 HISTORY — DX: Nocturia: R35.1

## 2015-05-26 LAB — BASIC METABOLIC PANEL
Anion gap: 7 (ref 5–15)
BUN: 20 mg/dL (ref 6–20)
CO2: 25 mmol/L (ref 22–32)
Calcium: 9.5 mg/dL (ref 8.9–10.3)
Chloride: 108 mmol/L (ref 101–111)
Creatinine, Ser: 0.99 mg/dL (ref 0.61–1.24)
GFR calc Af Amer: >60 mL/min (ref >60)
GFR calc non Af Amer: >60 mL/min (ref >60)
Glucose, Bld: 102 mg/dL — ABNORMAL HIGH (ref 65–99)
Potassium: 4.8 mmol/L (ref 3.5–5.1)
Sodium: 140 mmol/L (ref 135–145)

## 2015-05-26 LAB — CBC WITH DIFFERENTIAL/PLATELET
BASOS ABS: 0.1 10*3/uL (ref 0.0–0.1)
BASOS PCT: 1 %
EOS ABS: 0.4 10*3/uL (ref 0.0–0.7)
EOS PCT: 6 %
HCT: 42.8 % (ref 39.0–52.0)
Hemoglobin: 14.9 g/dL (ref 13.0–17.0)
Lymphocytes Relative: 39 %
Lymphs Abs: 2.6 10*3/uL (ref 0.7–4.0)
MCH: 29 pg (ref 26.0–34.0)
MCHC: 34.8 g/dL (ref 30.0–36.0)
MCV: 83.4 fL (ref 78.0–100.0)
MONO ABS: 0.6 10*3/uL (ref 0.1–1.0)
MONOS PCT: 9 %
Neutro Abs: 3 10*3/uL (ref 1.7–7.7)
Neutrophils Relative %: 45 %
PLATELETS: 155 10*3/uL (ref 150–400)
RBC: 5.13 MIL/uL (ref 4.22–5.81)
RDW: 15.1 % (ref 11.5–15.5)
WBC: 6.6 10*3/uL (ref 4.0–10.5)

## 2015-05-26 NOTE — Progress Notes (Addendum)
Cardiologist is Dr.David Para March  Medical Md Dr.David Hopper  Echo and heart cath reports in epic from 12-16-14  EKG and CXR in epic from 03-11-15  Stress test done around 2009

## 2015-05-26 NOTE — Progress Notes (Signed)
Pt states he was instructed by cardiologist to stop his ASA 325mg  today and start 81mg  up until day of surgery,just not to take the morning of procedure.

## 2015-05-27 NOTE — Progress Notes (Signed)
Anesthesia Chart Review:  Pt is 64 year old male scheduled for open B inguinal hernia repair, insertion of mesh on 06/01/2015 with Dr. Sheliah Hatch.   PCP is Dr. Janace Hoard. Cardiologist is Dr. Delsa Sale, who cleared pt for surgery in Care everywhere telephone encounter dated 03/21/15.   PMH includes:  CAD (MI, CABG 12/21/2014: LIMA toLAD, SVG to intermediate, PDA and OM), hyperlipidemia. Former smoker. BMI 27. S/p resection R upper lobe blebs 01/18/15.   Medications: ASA, lipitor.   Preoperative labs reviewed.   Chest x-ray 03/11/15 reviewed. No active cardiopulmonary disease.   EKG 03/11/15: sinus rhythm. Old inferior infarct.   PFTs 12/20/14 (care everywhere): Normal spirometry  Echo 12/16/14 (care everywhere):  - The left ventricle is normal in size. The left ventricular ejection fraction is normal (60-65%). The left ventricular wall motion is normal. - The aortic valve is trileaflet with thin, pliable leaflets that move normally. - The left ventricular diastolic function is normal. There is mild concentric left ventricular hypertrophy. - There is mild (1+) mitral regurgitation. - The left atrium is mildly dilated.  Cardiac cath 12/16/14 (care everywhere):  1. Failed attempt at PCI of the right coronary artery, unable to cross with any wire after several attempts. 2. Severe three-vessel coronary artery disease. 3. Normal left ventricular end-diastolic pressure of 10 mm Hg. 4. Successful insertion of an intra-aortic balloon pump due to presence of left main disease, as well as completely occluded right coronary artery. 5. Coronary angiography and percutaneous coronary intervention performed with access through the right radial artery. PLAN: 1. CT surgical evaluation for coronary artery bypass grafting. 2. Medical management of coronary artery disease. 3. Patient to remain in intensive care unit while with intra-aortic balloon pump.  If no changes, I anticipate pt can proceed  with surgery as scheduled.   Rica Mast, FNP-BC [facility]/Anesthesiology Phone: [phone] 05/27/2015 10:37 AM

## 2015-05-31 MED ORDER — HEPARIN SODIUM (PORCINE) 5000 UNIT/ML IJ SOLN
5000.0000 [IU] | Freq: Once | INTRAMUSCULAR | Status: AC
  Administered 2015-06-01: 5000 [IU] via SUBCUTANEOUS
  Filled 2015-05-31: qty 1

## 2015-05-31 MED ORDER — CEFAZOLIN SODIUM-DEXTROSE 2-3 GM-% IV SOLR
2.0000 g | INTRAVENOUS | Status: AC
  Administered 2015-06-01: 2 g via INTRAVENOUS
  Filled 2015-05-31: qty 50

## 2015-06-01 ENCOUNTER — Inpatient Hospital Stay (HOSPITAL_COMMUNITY)
Admission: AD | Admit: 2015-06-01 | Discharge: 2015-06-02 | DRG: 352 | Disposition: A | Discharge status: Home / Self Care | Payer: 59 | Source: Ambulatory Visit | Attending: General Surgery | Admitting: General Surgery

## 2015-06-01 ENCOUNTER — Encounter (HOSPITAL_COMMUNITY)
Admission: AD | Disposition: A | Discharge status: Home / Self Care | Payer: Self-pay | Source: Ambulatory Visit | Attending: General Surgery

## 2015-06-01 ENCOUNTER — Ambulatory Visit (HOSPITAL_COMMUNITY): Payer: 59 | Admitting: Emergency Medicine

## 2015-06-01 ENCOUNTER — Ambulatory Visit (HOSPITAL_COMMUNITY): Payer: 59 | Admitting: Certified Registered Nurse Anesthetist

## 2015-06-01 ENCOUNTER — Encounter (HOSPITAL_COMMUNITY): Payer: Self-pay | Admitting: Certified Registered Nurse Anesthetist

## 2015-06-01 DIAGNOSIS — Z7982 Long term (current) use of aspirin: Secondary | ICD-10-CM

## 2015-06-01 DIAGNOSIS — E785 Hyperlipidemia, unspecified: Secondary | ICD-10-CM | POA: Diagnosis present

## 2015-06-01 DIAGNOSIS — K4020 Bilateral inguinal hernia, without obstruction or gangrene, not specified as recurrent: Principal | ICD-10-CM | POA: Diagnosis present

## 2015-06-01 DIAGNOSIS — Z23 Encounter for immunization: Secondary | ICD-10-CM

## 2015-06-01 DIAGNOSIS — Z951 Presence of aortocoronary bypass graft: Secondary | ICD-10-CM

## 2015-06-01 DIAGNOSIS — Z79899 Other long term (current) drug therapy: Secondary | ICD-10-CM | POA: Diagnosis not present

## 2015-06-01 DIAGNOSIS — I2510 Atherosclerotic heart disease of native coronary artery without angina pectoris: Secondary | ICD-10-CM | POA: Diagnosis present

## 2015-06-01 DIAGNOSIS — Z87891 Personal history of nicotine dependence: Secondary | ICD-10-CM | POA: Diagnosis not present

## 2015-06-01 DIAGNOSIS — I252 Old myocardial infarction: Secondary | ICD-10-CM | POA: Diagnosis not present

## 2015-06-01 HISTORY — PX: INGUINAL HERNIA REPAIR: SHX194

## 2015-06-01 HISTORY — PX: INSERTION OF MESH: SHX5868

## 2015-06-01 LAB — CREATININE, SERUM
CREATININE: 1.01 mg/dL (ref 0.61–1.24)
GFR calc Af Amer: >60 mL/min (ref >60)
GFR calc non Af Amer: >60 mL/min (ref >60)

## 2015-06-01 LAB — CBC
HCT: 40 % (ref 39.0–52.0)
Hemoglobin: 13.3 g/dL (ref 13.0–17.0)
MCH: 28.1 pg (ref 26.0–34.0)
MCHC: 33.3 g/dL (ref 30.0–36.0)
MCV: 84.4 fL (ref 78.0–100.0)
PLATELETS: 152 10*3/uL (ref 150–400)
RBC: 4.74 MIL/uL (ref 4.22–5.81)
RDW: 15.1 % (ref 11.5–15.5)
WBC: 8.6 10*3/uL (ref 4.0–10.5)

## 2015-06-01 SURGERY — REPAIR, HERNIA, INGUINAL, BILATERAL, ADULT
Anesthesia: General | Site: Groin | Laterality: Bilateral

## 2015-06-01 MED ORDER — LIDOCAINE HCL (CARDIAC) 20 MG/ML IV SOLN
INTRAVENOUS | Status: DC | PRN
  Administered 2015-06-01: 50 mg via INTRAVENOUS
  Administered 2015-06-01: 50 mg via INTRATRACHEAL

## 2015-06-01 MED ORDER — ARTIFICIAL TEARS OP OINT
TOPICAL_OINTMENT | OPHTHALMIC | Status: AC
  Filled 2015-06-01: qty 3.5

## 2015-06-01 MED ORDER — HEPARIN SODIUM (PORCINE) 5000 UNIT/ML IJ SOLN
INTRAMUSCULAR | Status: AC
  Administered 2015-06-01: 5000 [IU] via SUBCUTANEOUS
  Filled 2015-06-01: qty 1

## 2015-06-01 MED ORDER — SIMETHICONE 80 MG PO CHEW: 40.0000 mg | CHEWABLE_TABLET | Freq: Four times a day (QID) | ORAL | Status: DC | PRN

## 2015-06-01 MED ORDER — MIDAZOLAM HCL 2 MG/2ML IJ SOLN
INTRAMUSCULAR | Status: AC
  Filled 2015-06-01: qty 2

## 2015-06-01 MED ORDER — ROCURONIUM BROMIDE 100 MG/10ML IV SOLN
INTRAVENOUS | Status: DC | PRN
  Administered 2015-06-01: 50 mg via INTRAVENOUS

## 2015-06-01 MED ORDER — LACTATED RINGERS IV SOLN
INTRAVENOUS | Status: DC | PRN
  Administered 2015-06-01 (×2): via INTRAVENOUS

## 2015-06-01 MED ORDER — GLYCOPYRROLATE 0.2 MG/ML IJ SOLN
INTRAMUSCULAR | Status: DC | PRN
  Administered 2015-06-01: 0.2 mg via INTRAVENOUS

## 2015-06-01 MED ORDER — FENTANYL CITRATE (PF) 250 MCG/5ML IJ SOLN
INTRAMUSCULAR | Status: AC
  Filled 2015-06-01: qty 5

## 2015-06-01 MED ORDER — FENTANYL CITRATE (PF) 100 MCG/2ML IJ SOLN
INTRAMUSCULAR | Status: DC | PRN
  Administered 2015-06-01: 100 ug via INTRAVENOUS
  Administered 2015-06-01 (×3): 50 ug via INTRAVENOUS

## 2015-06-01 MED ORDER — KCL IN DEXTROSE-NACL 20-5-0.45 MEQ/L-%-% IV SOLN
INTRAVENOUS | Status: DC
  Administered 2015-06-01 (×2): via INTRAVENOUS
  Filled 2015-06-01: qty 1000

## 2015-06-01 MED ORDER — NEOSTIGMINE METHYLSULFATE 10 MG/10ML IV SOLN
INTRAVENOUS | Status: AC
  Filled 2015-06-01: qty 1

## 2015-06-01 MED ORDER — BUPIVACAINE LIPOSOME 1.3 % IJ SUSP
20.0000 mL | INTRAMUSCULAR | Status: AC
  Administered 2015-06-01: 20 mL
  Filled 2015-06-01: qty 20

## 2015-06-01 MED ORDER — ACETAMINOPHEN 325 MG PO TABS: 650.0000 mg | ORAL_TABLET | Freq: Four times a day (QID) | ORAL | Status: DC | PRN

## 2015-06-01 MED ORDER — ROCURONIUM BROMIDE 50 MG/5ML IV SOLN
INTRAVENOUS | Status: AC
  Filled 2015-06-01: qty 1

## 2015-06-01 MED ORDER — ONDANSETRON HCL 4 MG/2ML IJ SOLN
INTRAMUSCULAR | Status: DC | PRN
  Administered 2015-06-01: 4 mg via INTRAVENOUS

## 2015-06-01 MED ORDER — OXYCODONE HCL 5 MG PO TABS
5.0000 mg | ORAL_TABLET | ORAL | Status: DC | PRN
  Administered 2015-06-01 – 2015-06-02 (×3): 10 mg via ORAL
  Filled 2015-06-01 (×3): qty 2

## 2015-06-01 MED ORDER — KETOROLAC TROMETHAMINE 30 MG/ML IJ SOLN
30.0000 mg | Freq: Once | INTRAMUSCULAR | Status: AC
  Administered 2015-06-01: 30 mg via INTRAVENOUS

## 2015-06-01 MED ORDER — KETOROLAC TROMETHAMINE 30 MG/ML IJ SOLN
INTRAMUSCULAR | Status: AC
Start: 2015-06-01 — End: 2015-06-01
  Filled 2015-06-01: qty 1

## 2015-06-01 MED ORDER — ONDANSETRON 4 MG PO TBDP: 4.0000 mg | ORAL_TABLET | Freq: Four times a day (QID) | ORAL | Status: DC | PRN

## 2015-06-01 MED ORDER — PROPOFOL 10 MG/ML IV BOLUS
INTRAVENOUS | Status: AC
  Filled 2015-06-01: qty 40

## 2015-06-01 MED ORDER — ONDANSETRON HCL 4 MG/2ML IJ SOLN
INTRAMUSCULAR | Status: AC
  Filled 2015-06-01: qty 2

## 2015-06-01 MED ORDER — PROPOFOL 10 MG/ML IV BOLUS
INTRAVENOUS | Status: DC | PRN
  Administered 2015-06-01: 150 mg via INTRAVENOUS

## 2015-06-01 MED ORDER — SENNOSIDES-DOCUSATE SODIUM 8.6-50 MG PO TABS: 1.0000 | ORAL_TABLET | Freq: Every evening | ORAL | Status: DC | PRN

## 2015-06-01 MED ORDER — ONDANSETRON HCL 4 MG/2ML IJ SOLN: 4.0000 mg | Freq: Four times a day (QID) | INTRAMUSCULAR | Status: DC | PRN

## 2015-06-01 MED ORDER — ACETAMINOPHEN 650 MG RE SUPP
650.0000 mg | Freq: Four times a day (QID) | RECTAL | Status: DC | PRN
Start: 2015-06-01 — End: 2015-06-02

## 2015-06-01 MED ORDER — SODIUM CHLORIDE 0.9 % IJ SOLN
INTRAMUSCULAR | Status: DC | PRN
  Administered 2015-06-01: 40 mL

## 2015-06-01 MED ORDER — PHENYLEPHRINE HCL 10 MG/ML IJ SOLN
INTRAMUSCULAR | Status: DC | PRN
  Administered 2015-06-01 (×4): 80 ug via INTRAVENOUS
  Administered 2015-06-01: 40 ug via INTRAVENOUS
  Administered 2015-06-01: 80 ug via INTRAVENOUS
  Administered 2015-06-01: 40 ug via INTRAVENOUS

## 2015-06-01 MED ORDER — NEOSTIGMINE METHYLSULFATE 10 MG/10ML IV SOLN
INTRAVENOUS | Status: DC | PRN
  Administered 2015-06-01: 2 mg via INTRAVENOUS

## 2015-06-01 MED ORDER — ATORVASTATIN CALCIUM 40 MG PO TABS
40.0000 mg | ORAL_TABLET | Freq: Every day | ORAL | Status: DC
  Administered 2015-06-01 – 2015-06-02 (×2): 40 mg via ORAL
  Filled 2015-06-01 (×2): qty 1

## 2015-06-01 MED ORDER — MIDAZOLAM HCL 5 MG/5ML IJ SOLN
INTRAMUSCULAR | Status: DC | PRN
  Administered 2015-06-01: 2 mg via INTRAVENOUS

## 2015-06-01 MED ORDER — HYDROMORPHONE HCL 1 MG/ML IJ SOLN
0.2500 mg | INTRAMUSCULAR | Status: DC | PRN
Start: 2015-06-01 — End: 2015-06-01

## 2015-06-01 MED ORDER — PHENYLEPHRINE HCL 10 MG/ML IJ SOLN
10.0000 mg | INTRAVENOUS | Status: DC | PRN
  Administered 2015-06-01: 50 ug/min via INTRAVENOUS

## 2015-06-01 MED ORDER — ENOXAPARIN SODIUM 40 MG/0.4ML ~~LOC~~ SOLN
40.0000 mg | SUBCUTANEOUS | Status: DC
  Administered 2015-06-02: 40 mg via SUBCUTANEOUS
  Filled 2015-06-01: qty 0.4

## 2015-06-01 MED ORDER — KCL IN DEXTROSE-NACL 20-5-0.45 MEQ/L-%-% IV SOLN
INTRAVENOUS | Status: AC
Start: 2015-06-01 — End: 2015-06-01
  Administered 2015-06-01: 12:00:00
  Filled 2015-06-01: qty 1000

## 2015-06-01 MED ORDER — GLYCOPYRROLATE 0.2 MG/ML IJ SOLN
INTRAMUSCULAR | Status: AC
  Filled 2015-06-01: qty 1

## 2015-06-01 MED ORDER — MORPHINE SULFATE (PF) 2 MG/ML IV SOLN
2.0000 mg | INTRAVENOUS | Status: DC | PRN
  Administered 2015-06-01 (×2): 2 mg via INTRAVENOUS
  Administered 2015-06-02 (×2): 4 mg via INTRAVENOUS
  Filled 2015-06-01: qty 2
  Filled 2015-06-01: qty 1
  Filled 2015-06-01: qty 2
  Filled 2015-06-01: qty 1

## 2015-06-01 MED ORDER — 0.9 % SODIUM CHLORIDE (POUR BTL) OPTIME
TOPICAL | Status: DC | PRN
  Administered 2015-06-01: 1000 mL

## 2015-06-01 MED ORDER — PROMETHAZINE HCL 25 MG/ML IJ SOLN: 6.2500 mg | INTRAMUSCULAR | Status: DC | PRN

## 2015-06-01 SURGICAL SUPPLY — 48 items
APL SKNCLS STERI-STRIP NONHPOA (GAUZE/BANDAGES/DRESSINGS)
BENZOIN TINCTURE PRP APPL 2/3 (GAUZE/BANDAGES/DRESSINGS) ×1 IMPLANT
BLADE SURG ROTATE 9660 (MISCELLANEOUS) IMPLANT
CANISTER SUCTION 2500CC (MISCELLANEOUS) IMPLANT
CELLS DAT CNTRL 66122 CELL SVR (MISCELLANEOUS) IMPLANT
CHLORAPREP W/TINT 26ML (MISCELLANEOUS) ×3 IMPLANT
COVER SURGICAL LIGHT HANDLE (MISCELLANEOUS) ×2 IMPLANT
DECANTER SPIKE VIAL GLASS SM (MISCELLANEOUS) IMPLANT
DRAIN PENROSE 1/4X12 LTX STRL (WOUND CARE) IMPLANT
DRAPE LAPAROSCOPIC ABDOMINAL (DRAPES) ×2 IMPLANT
DRSG COVADERM 4X6 (GAUZE/BANDAGES/DRESSINGS) ×2 IMPLANT
ELECT CAUTERY BLADE 6.4 (BLADE) ×2 IMPLANT
ELECT REM PT RETURN 9FT ADLT (ELECTROSURGICAL) ×2
ELECTRODE REM PT RTRN 9FT ADLT (ELECTROSURGICAL) ×1 IMPLANT
GAUZE SPONGE 4X4 16PLY XRAY LF (GAUZE/BANDAGES/DRESSINGS) ×1 IMPLANT
GLOVE BIO SURGEON STRL SZ7 (GLOVE) ×1 IMPLANT
GLOVE BIOGEL PI IND STRL 7.0 (GLOVE) ×1 IMPLANT
GLOVE BIOGEL PI IND STRL 7.5 (GLOVE) IMPLANT
GLOVE BIOGEL PI INDICATOR 7.0 (GLOVE) ×2
GLOVE BIOGEL PI INDICATOR 7.5 (GLOVE) ×1
GLOVE SURG SS PI 7.0 STRL IVOR (GLOVE) ×3 IMPLANT
GOWN STRL REUS W/ TWL LRG LVL3 (GOWN DISPOSABLE) ×2 IMPLANT
GOWN STRL REUS W/TWL LRG LVL3 (GOWN DISPOSABLE) ×8
KIT BASIN OR (CUSTOM PROCEDURE TRAY) ×2 IMPLANT
KIT ROOM TURNOVER OR (KITS) ×2 IMPLANT
LIQUID BAND (GAUZE/BANDAGES/DRESSINGS) ×1 IMPLANT
MESH BARD SOFT 3X6IN (Mesh General) ×2 IMPLANT
NS IRRIG 1000ML POUR BTL (IV SOLUTION) ×2 IMPLANT
PACK GENERAL/GYN (CUSTOM PROCEDURE TRAY) ×2 IMPLANT
PAD ARMBOARD 7.5X6 YLW CONV (MISCELLANEOUS) ×2 IMPLANT
PEN SKIN MARKING BROAD (MISCELLANEOUS) ×2 IMPLANT
RETRACTOR WND ALEXIS 18 MED (MISCELLANEOUS) IMPLANT
RTRCTR WOUND ALEXIS 18CM MED (MISCELLANEOUS)
RTRCTR WOUND ALEXIS 18CM SML (INSTRUMENTS)
SAVER CELL AAL HAEMONETICS (INSTRUMENTS) IMPLANT
SPONGE GAUZE 4X4 12PLY STER LF (GAUZE/BANDAGES/DRESSINGS) ×2 IMPLANT
STRIP CLOSURE SKIN 1/2X4 (GAUZE/BANDAGES/DRESSINGS) ×2 IMPLANT
SUT MNCRL AB 4-0 PS2 18 (SUTURE) ×2 IMPLANT
SUT PROLENE 0 CT 1 CR/8 (SUTURE) ×3 IMPLANT
SUT PROLENE 0 CTX CR/8 (SUTURE) ×1 IMPLANT
SUT VIC AB 2-0 SH 27 (SUTURE) ×2
SUT VIC AB 2-0 SH 27X BRD (SUTURE) ×1 IMPLANT
SUT VIC AB 3-0 SH 27 (SUTURE) ×2
SUT VIC AB 3-0 SH 27XBRD (SUTURE) ×1 IMPLANT
SYR CONTROL 10ML LL (SYRINGE) ×2 IMPLANT
TOWEL OR 17X24 6PK STRL BLUE (TOWEL DISPOSABLE) ×2 IMPLANT
TOWEL OR 17X26 10 PK STRL BLUE (TOWEL DISPOSABLE) ×2 IMPLANT
TRAY FOLEY CATH 16FRSI W/METER (SET/KITS/TRAYS/PACK) ×1 IMPLANT

## 2015-06-01 NOTE — Care Management Note (Signed)
Case Management Note  Patient Details  Name: Alexander Hunter MRN: [id number] Date of Birth: 11-29-1950  Subjective/Objective:                    Action/Plan:  Initial UR completed  Expected Discharge Date:                  Expected Discharge Plan:  Home/Self Care  In-House Referral:     Discharge planning Services     Post Acute Care Choice:    Choice offered to:     DME Arranged:    DME Agency:     HH Arranged:    HH Agency:     Status of Service:  In process, will continue to follow  Medicare Important Message Given:    Date Medicare IM Given:    Medicare IM give by:    Date Additional Medicare IM Given:    Additional Medicare Important Message give by:     If discussed at Long Length of Stay Meetings, dates discussed:    Additional Comments:  Kingsley Plan, RN 06/01/2015, 1:33 PM

## 2015-06-01 NOTE — Anesthesia Postprocedure Evaluation (Signed)
Anesthesia Post Note  Patient: Alexander Hunter  Procedure(s) Performed: Procedure(s) (LRB): OPEN  BILATERAL HERNIA REPAIR (Bilateral) INSERTION OF MESH (Bilateral)  Patient location during evaluation: PACU Anesthesia Type: General Level of consciousness: awake and alert Pain management: pain level controlled Vital Signs Assessment: post-procedure vital signs reviewed and stable Respiratory status: spontaneous breathing and respiratory function stable Cardiovascular status: stable Postop Assessment: no signs of nausea or vomiting Anesthetic complications: no    Last Vitals:  Filed Vitals:   06/01/15 1101 06/01/15 1102  BP: 119/77   Pulse: 74   Temp:  36.7 C  Resp: 17     Last Pain: There were no vitals filed for this visit.               Sanjay Broadfoot S

## 2015-06-01 NOTE — Transfer of Care (Signed)
Immediate Anesthesia Transfer of Care Note  Patient: Alexander Hunter  Procedure(s) Performed: Procedure(s): OPEN  BILATERAL HERNIA REPAIR (Bilateral) INSERTION OF MESH (Bilateral)  Patient Location: PACU  Anesthesia Type:General  Level of Consciousness: awake and alert   Airway & Oxygen Therapy: Patient Spontanous Breathing and Patient connected to nasal cannula oxygen  Post-op Assessment: Report given to RN and Post -op Vital signs reviewed and stable  Post vital signs: Reviewed and stable  Last Vitals:  Filed Vitals:   06/01/15 0653  BP: 123/82  Pulse: 85  Temp: 36.4 C  Resp: 20    Complications: No apparent anesthesia complications

## 2015-06-01 NOTE — Op Note (Signed)
Preoperative diagnosis: left recurrent inguinal hernia, right inguinal hernia  Postoperative diagnosis: left recurrent direct inguinal hernia, right direct inguinal hernia   Procedure: left recurrent inguinal hernia repair with mesh, right inguinal hernia repair with mesh  Surgeon: Feliciana Rossetti, M.D.  Asst: Marcille Blanco, MD  Anesthesia: general   Indications for procedure: Alexander Hunter is a 64 y.o. year old male with symptoms of bilateral groin pain especially with lifting. He is required to lift heavy loads at work and notes the pain getting more frequent. He denies fever, chills, nausea, vomiting, constipation.  Description of procedure: patient was brought to the upper sleepless supine anesthesia was administered with endotracheal tube.  WHO checklist was applied. Foley was placed under sterile conditions.  Patient was then prepped and draped in the usual sterile fashion.  Next a oblique incision was made approximately 1 cm superior to the inguinal ligament on the left side.  Cautery was used to dissect down through Camper's and Scarpa's fascia to identify the external abdominal oblique muscle.  This fascia was somewhat obliterated from previous repair. The hernia sac and spermatic cord and structures were identifiedN Freeway from the external abdominal oblique fascia.  The inguinal ligament was identified as well as the conjoined tendon as well as the pubic tubercle.  The hernia sac and spermatic cord were completely isolated and separated from one another care was taken not to injure he vas deferens or vessels with it. Once this wascompletely mobile is evident that this was a direct hernia with a large sac.  6 x 3 cm piece of soft Bard mesh was placed into the area and sutured in place with 0 Prolene in interrupted fashion to the lacunar ligament and inguinal ligament and conjoined tendon. The inguinal canal was re-created by closing the external abdominal oblique fascia back together using a 2-0  Vicryl in running fashion.  Next attention was turned to the right side of the abdomen.  A matching oblique incision was made 1 cm superior to the inguinal ligament on the right side.  Cautery was used to dissect down through Camper's and Scarpa's fascia to identify the external abdominal oblique fascia. Fascia was sharply separated down to the external inguinal ring. He hernia sac and spermatic cord and structures were identified and bluntly separated from each other.  Again this appeared to be a direct inguinal hernia. Aa 6 x 3 cm piece of soft Bard mesh was placed and sutured in place with 0 Prolene in interrupted fashion to the lacunar ligament and inguinal ligament and conjoined tendon.  Findings: right direct inguinal hernia, left direct inguinal hernia  Specimen: none  Implant: Bard soft mesh 3x6cm x2  Blood loss: <100cc  Local anesthesia: 20cc exparel diluted with 40cc saline  Complications: none  Feliciana Rossetti, M.D. General, Bariatric, & Minimally Invasive Surgery [facility]

## 2015-06-01 NOTE — H&P (Signed)
Alexander Hunter is an 64 y.o. male.   Chief Complaint: bilateral groin bulges HPI: 64 yo male with bilateral groin bulges. His right side has intermittent pain worse with lifting. He has noted the bulge for 6 months. He denies nausea, vomiting or constipation. He has had a colonoscopy within 5 years that was negative for malignancy  Past Medical History  Diagnosis Date  . HLD (hyperlipidemia)     takes Atorvastatin daily  . Coronary artery disease   . Myocardial infarction (HCC) 11/2014  . Shortness of breath dyspnea     rarely but notices with exertion.States doesn't happen during cardiac reheab  . Pneumonia     as a child  . Back pain     states has been like this for yrs  . Nocturia   . History of blood transfusion 11/2014    no abnormal reaction noted  . Insomnia     doesn't take any meds    Past Surgical History  Procedure Laterality Date  . Tonsillectomy and adenoidectomy      as a child  . Hernia repair      as a child  . Cardiac catheterization  12-16-14  . Coronary artery bypass graft  11/2014    x 4-done at [facility]    Family History  Problem Relation Age of Onset  . Heart attack Mother   . Heart disease Mother     Heart attack  . Heart attack Father   . Heart disease Father     Heart attack  . Heart attack Maternal Grandmother   . Heart disease Maternal Grandmother     Heart attack  . Heart attack Maternal Grandfather   . Heart disease Maternal Grandfather     Heart attack   Social History:  reports that he has quit smoking. He has never used smokeless tobacco. He reports that he drinks alcohol. He reports that he does not use illicit drugs.  Allergies: No Known Allergies  Medications Prior to Admission  Medication Sig Dispense Refill  . aspirin 325 MG tablet Take 325 mg by mouth daily.    Marland Kitchen aspirin EC 81 MG tablet Take 81 mg by mouth daily.    Marland Kitchen atorvastatin (LIPITOR) 40 MG tablet Take 40 mg by mouth daily.      No results found for this or any  previous visit (from the past 48 hour(s)). No results found.  Review of Systems  Constitutional: Negative for fever and chills.  HENT: Negative for hearing loss.   Eyes: Negative for blurred vision and double vision.  Respiratory: Negative for cough and hemoptysis.   Cardiovascular: Negative for chest pain and palpitations.  Gastrointestinal: Negative for nausea, vomiting and abdominal pain.  Genitourinary: Negative for dysuria and urgency.  Musculoskeletal: Negative for myalgias and neck pain.  Skin: Negative for itching and rash.  Neurological: Negative for dizziness, tingling and headaches.  Endo/Heme/Allergies: Does not bruise/bleed easily.  Psychiatric/Behavioral: Negative for depression and suicidal ideas.    Blood pressure 123/82, pulse 85, temperature 97.6 F (36.4 C), temperature source Oral, resp. rate 20, height 5\' 7"  (1.702 m), weight 78.132 kg (172 lb 4 oz), SpO2 98 %. Physical Exam  Vitals reviewed. Constitutional: He is oriented to person, place, and time. He appears well-developed and well-nourished.  HENT:  Head: Normocephalic and atraumatic.  Eyes: Conjunctivae and EOM are normal. Pupils are equal, round, and reactive to light.  Neck: Normal range of motion. Neck supple.  Cardiovascular: Normal rate and regular rhythm.  Respiratory: Effort normal and breath sounds normal.  GI: Soft. Bowel sounds are normal. He exhibits no distension. There is no tenderness.  Bilateral inguinal hernias, reducible  Musculoskeletal: Normal range of motion.  Neurological: He is alert and oriented to person, place, and time.  Skin: Skin is warm and dry.  Psychiatric: He has a normal mood and affect. His behavior is normal.     Assessment/Plan Bilateral inguinal hernias causing symptoms. He does heavy lifting for his job. We discussed the pathology of a hernia that they get larger not smaller, that they carry a small risk of incarceration/strangulation of intraabdominal contents,  and that if they are symptomatic now they are likely to get more symptomatic in the future. We discussed options of open versus laparoscopic approach and given the size of his hernia I think the open approach is the best option for him in terms of long term durability. -bilateral open inguinal hernia repair -foley overnight -overnight stay  De Blanch Chyna Kneece 06/01/2015, 7:18 AM

## 2015-06-01 NOTE — Anesthesia Preprocedure Evaluation (Signed)
Anesthesia Evaluation  Patient identified by MRN, date of birth, ID band Patient awake    Reviewed: Allergy & Precautions, NPO status , Patient's Chart, lab work & pertinent test results  Airway Mallampati: II  TM Distance: >3 FB Neck ROM: Full    Dental no notable dental hx.    Pulmonary neg pulmonary ROS, former smoker,    Pulmonary exam normal breath sounds clear to auscultation       Cardiovascular hypertension, + CAD, + Past MI and + CABG  Normal cardiovascular exam Rhythm:Regular Rate:Normal     Neuro/Psych negative neurological ROS  negative psych ROS   GI/Hepatic negative GI ROS, Neg liver ROS,   Endo/Other  negative endocrine ROS  Renal/GU negative Renal ROS  negative genitourinary   Musculoskeletal negative musculoskeletal ROS (+)   Abdominal   Peds negative pediatric ROS (+)  Hematology negative hematology ROS (+)   Anesthesia Other Findings   Reproductive/Obstetrics negative OB ROS                             Anesthesia Physical Anesthesia Plan  ASA: III  Anesthesia Plan: General   Post-op Pain Management:    Induction: Intravenous  Airway Management Planned: Oral ETT and LMA  Additional Equipment:   Intra-op Plan:   Post-operative Plan: Extubation in OR  Informed Consent: I have reviewed the patients History and Physical, chart, labs and discussed the procedure including the risks, benefits and alternatives for the proposed anesthesia with the patient or authorized representative who has indicated his/her understanding and acceptance.   Dental advisory given  Plan Discussed with: CRNA and Surgeon  Anesthesia Plan Comments:         Anesthesia Quick Evaluation

## 2015-06-01 NOTE — Anesthesia Procedure Notes (Signed)
Procedure Name: Intubation Date/Time: 06/01/2015 8:37 AM Performed by: Maryland Pink Pre-anesthesia Checklist: Patient identified, Emergency Drugs available, Suction available, Patient being monitored and Timeout performed Patient Re-evaluated:Patient Re-evaluated prior to inductionOxygen Delivery Method: Circle system utilized Preoxygenation: Pre-oxygenation with 100% oxygen Intubation Type: IV induction Ventilation: Mask ventilation without difficulty Laryngoscope Size: Mac and 4 Grade View: Grade I Tube type: Oral Tube size: 7.5 mm Number of attempts: 1 Airway Equipment and Method: Stylet and LTA kit utilized Placement Confirmation: ETT inserted through vocal cords under direct vision,  positive ETCO2 and breath sounds checked- equal and bilateral Secured at: 22 cm Tube secured with: Tape Dental Injury: Teeth and Oropharynx as per pre-operative assessment

## 2015-06-02 ENCOUNTER — Encounter (HOSPITAL_COMMUNITY): Payer: Self-pay | Admitting: General Surgery

## 2015-06-02 LAB — CBC
HEMATOCRIT: 37.9 % — AB (ref 39.0–52.0)
HEMOGLOBIN: 12.1 g/dL — AB (ref 13.0–17.0)
MCH: 27.3 pg (ref 26.0–34.0)
MCHC: 31.9 g/dL (ref 30.0–36.0)
MCV: 85.6 fL (ref 78.0–100.0)
Platelets: 138 10*3/uL — ABNORMAL LOW (ref 150–400)
RBC: 4.43 MIL/uL (ref 4.22–5.81)
RDW: 15.4 % (ref 11.5–15.5)
WBC: 7 10*3/uL (ref 4.0–10.5)

## 2015-06-02 MED ORDER — SENNOSIDES-DOCUSATE SODIUM 8.6-50 MG PO TABS: 1.0000 | ORAL_TABLET | Freq: Every evening | ORAL | Status: DC | PRN

## 2015-06-02 MED ORDER — IBUPROFEN 400 MG PO TABS: 400.0000 mg | ORAL_TABLET | Freq: Three times a day (TID) | ORAL | Status: DC | PRN

## 2015-06-02 MED ORDER — KETOROLAC TROMETHAMINE 30 MG/ML IJ SOLN: 30.0000 mg | Freq: Once | INTRAMUSCULAR | Status: AC

## 2015-06-02 MED ORDER — KETOROLAC TROMETHAMINE 30 MG/ML IJ SOLN
30.0000 mg | Freq: Once | INTRAMUSCULAR | Status: AC
  Administered 2015-06-02: 30 mg via INTRAVENOUS
  Filled 2015-06-02: qty 1

## 2015-06-02 MED ORDER — OXYCODONE HCL 5 MG PO TABS: 5.0000 mg | ORAL_TABLET | ORAL | Status: DC | PRN

## 2015-06-02 MED ORDER — PNEUMOCOCCAL VAC POLYVALENT 25 MCG/0.5ML IJ INJ
0.5000 mL | INJECTION | INTRAMUSCULAR | Status: AC
  Administered 2015-06-02: 0.5 mL via INTRAMUSCULAR
  Filled 2015-06-02: qty 0.5

## 2015-06-06 NOTE — Discharge Summary (Signed)
Physician Discharge Summary  Patient ID: Alexander Hunter MRN: [id number] DOB/AGE: 62-Aug-1952 62 y.o.  Admit date: 06/01/2015 Discharge date: 06/06/2015  Admission Diagnoses:  Discharge Diagnoses:  Active Problems:   Bilateral inguinal hernia   Discharged Condition: good  Hospital Course: Alexander Hunter was admitted after bilateral inguinal hernia repair. POD 1 his foley was removed, he tolerated a regular diet, he was able to ambulate and was discharged later that day after voiding.  Consults: None  Significant Diagnostic Studies:   Treatments: surgery; bilateral inguinal hernia repair  Discharge Exam: Blood pressure 121/67, pulse 77, temperature 98.6 F (37 C), temperature source Oral, resp. rate 18, height 5\' 7"  (1.702 m), weight 78.132 kg (172 lb 4 oz), SpO2 99 %. General appearance: alert and cooperative Head: Normocephalic, without obvious abnormality, atraumatic Neck: no adenopathy, no carotid bruit, no JVD, supple, symmetrical, trachea midline and thyroid not enlarged, symmetric, no tenderness/mass/nodules Resp: clear to auscultation bilaterally and normal percussion bilaterally Cardio: regular rate and rhythm, S1, S2 normal, no murmur, click, rub or gallop GI: soft, non-tender; bowel sounds normal; no masses,  no organomegaly  Disposition: 01-Home or Self Care  Discharge Instructions    Call MD for:  persistant nausea and vomiting    Complete by:  As directed      Call MD for:  redness, tenderness, or signs of infection (pain, swelling, redness, odor or green/yellow discharge around incision site)    Complete by:  As directed      Call MD for:  temperature >100.4    Complete by:  As directed      Diet - low sodium heart healthy    Complete by:  As directed      Increase activity slowly    Complete by:  As directed             Medication List    TAKE these medications        aspirin 325 MG tablet  Take 325 mg by mouth daily.     atorvastatin 40 MG tablet   Commonly known as:  LIPITOR  Take 40 mg by mouth daily.     ibuprofen 400 MG tablet  Commonly known as:  ADVIL,MOTRIN  Take 1 tablet (400 mg total) by mouth every 8 (eight) hours as needed.     oxyCODONE 5 MG immediate release tablet  Commonly known as:  Oxy IR/ROXICODONE  Take 1-2 tablets (5-10 mg total) by mouth every 4 (four) hours as needed for moderate pain.     senna-docusate 8.6-50 MG tablet  Commonly known as:  Senokot-S  Take 1 tablet by mouth at bedtime as needed for mild constipation.         Signed: De Blanch Mikeisha Lemonds 06/06/2015, 9:13 AM

## 2016-08-22 ENCOUNTER — Ambulatory Visit (INDEPENDENT_AMBULATORY_CARE_PROVIDER_SITE_OTHER): Payer: 59 | Admitting: Family Medicine

## 2016-08-22 ENCOUNTER — Encounter: Payer: Self-pay | Admitting: Family Medicine

## 2016-08-22 VITALS — BP 135/77 | HR 81 | Temp 98.1°F | Resp 18 | Ht 67.0 in | Wt 173.0 lb

## 2016-08-22 DIAGNOSIS — Z0000 Encounter for general adult medical examination without abnormal findings: Secondary | ICD-10-CM | POA: Diagnosis not present

## 2016-08-22 DIAGNOSIS — Z114 Encounter for screening for human immunodeficiency virus [HIV]: Secondary | ICD-10-CM

## 2016-08-22 DIAGNOSIS — I25810 Atherosclerosis of coronary artery bypass graft(s) without angina pectoris: Secondary | ICD-10-CM | POA: Diagnosis not present

## 2016-08-22 DIAGNOSIS — N5203 Combined arterial insufficiency and corporo-venous occlusive erectile dysfunction: Secondary | ICD-10-CM

## 2016-08-22 DIAGNOSIS — Z1211 Encounter for screening for malignant neoplasm of colon: Secondary | ICD-10-CM | POA: Diagnosis not present

## 2016-08-22 DIAGNOSIS — Z1159 Encounter for screening for other viral diseases: Secondary | ICD-10-CM

## 2016-08-22 DIAGNOSIS — Z125 Encounter for screening for malignant neoplasm of prostate: Secondary | ICD-10-CM

## 2016-08-22 DIAGNOSIS — D509 Iron deficiency anemia, unspecified: Secondary | ICD-10-CM

## 2016-08-22 LAB — POCT URINALYSIS DIP (MANUAL ENTRY)
BILIRUBIN UA: NEGATIVE
BILIRUBIN UA: NEGATIVE
Blood, UA: NEGATIVE
Glucose, UA: NEGATIVE
LEUKOCYTES UA: NEGATIVE
NITRITE UA: NEGATIVE
PH UA: 6.5
PROTEIN UA: NEGATIVE
Spec Grav, UA: 1.02
Urobilinogen, UA: 0.2

## 2016-08-22 MED ORDER — SILDENAFIL CITRATE 100 MG PO TABS: ORAL_TABLET | ORAL | 0 refills | Status: DC

## 2016-08-22 NOTE — Patient Instructions (Addendum)
IF you received an x-ray today, you will receive an invoice from [facility]. Please contact [facility] at [phone] with questions or concerns regarding your invoice.   IF you received labwork today, you will receive an invoice from [facility]. Please contact [facility] at [phone] with questions or concerns regarding your invoice.   Our billing staff will not be able to assist you with questions regarding bills from these companies.  You will be contacted with the lab results as soon as they are available. The fastest way to get your results is to activate your My Chart account. Instructions are located on the last page of this paperwork. If you have not heard from [place] regarding the results in 2 weeks, please contact this office.      Heart-Healthy Eating Plan Heart-healthy meal planning includes:  Limiting unhealthy fats.  Increasing healthy fats.  Making other small dietary changes. You may need to talk with your doctor or a diet specialist (dietitian) to create an eating plan that is right for you. What types of fat should I choose?  Choose healthy fats. These include olive oil and canola oil, flaxseeds, walnuts, almonds, and seeds.  Eat more omega-3 fats. These include salmon, mackerel, sardines, tuna, flaxseed oil, and ground flaxseeds. Try to eat fish at least twice each week.  Limit saturated fats.  Saturated fats are often found in animal products, such as meats, butter, and cream.  Plant sources of saturated fats include palm oil, palm kernel oil, and coconut oil.  Avoid foods with partially hydrogenated oils in them. These include stick margarine, some tub margarines, cookies, crackers, and other baked goods. These contain trans fats. What general guidelines do I need to follow?  Check food labels carefully. Identify foods with trans fats or high amounts of saturated fat.  Fill one half of your plate with vegetables and green salads. Eat  4-5 servings of vegetables per day. A serving of vegetables is:  1 cup of raw leafy vegetables.   cup of raw or cooked cut-up vegetables.   cup of vegetable juice.  Fill one fourth of your plate with whole grains. Look for the word "whole" as the first word in the ingredient list.  Fill one fourth of your plate with lean protein foods.  Eat 4-5 servings of fruit per day. A serving of fruit is:  One medium whole fruit.   cup of dried fruit.   cup of fresh, frozen, or canned fruit.   cup of 100% fruit juice.  Eat more foods that contain soluble fiber. These include apples, broccoli, carrots, beans, peas, and barley. Try to get 20-30 g of fiber per day.  Eat more home-cooked food. Eat less restaurant, buffet, and fast food.  Limit or avoid alcohol.  Limit foods high in starch and sugar.  Avoid fried foods.  Avoid frying your food. Try baking, boiling, grilling, or broiling it instead. You can also reduce fat by:  Removing the skin from poultry.  Removing all visible fats from meats.  Skimming the fat off of stews, soups, and gravies before serving them.  Steaming vegetables in water or broth.  Lose weight if you are overweight.  Eat 4-5 servings of nuts, legumes, and seeds per week:  One serving of dried beans or legumes equals  cup after being cooked.  One serving of nuts equals 1 ounces.  One serving of seeds equals  ounce or one tablespoon.  You may need to keep track of how  much salt or sodium you eat. This is especially true if you have high blood pressure. Talk with your doctor or dietitian to get more information. What foods can I eat? Grains  Breads, including [place], white, pita, wheat, raisin, rye, oatmeal, and [place]. Tortillas that are neither fried nor made with lard or trans fat. Low-fat rolls, including hotdog and hamburger buns and English muffins. Biscuits. Muffins. Waffles. Pancakes. Light popcorn. Whole-grain cereals. Flatbread. Melba  toast. Pretzels. Breadsticks. Rusks. Low-fat snacks. Low-fat crackers, including oyster, saltine, matzo, graham, animal, and rye. Rice and pasta, including brown rice and pastas that are made with whole wheat. Vegetables  All vegetables. Fruits  All fruits, but limit coconut. Meats and Other Protein Sources  Lean, well-trimmed beef, veal, pork, and lamb. Chicken and [place] without skin. All fish and shellfish. Wild duck, rabbit, pheasant, and venison. Egg whites or low-cholesterol egg substitutes. Dried beans, peas, lentils, and tofu. Seeds and most nuts. Dairy  Low-fat or nonfat cheeses, including ricotta, string, and mozzarella. Skim or 1% milk that is liquid, powdered, or evaporated. Buttermilk that is made with low-fat milk. Nonfat or low-fat yogurt. Beverages  Mineral water. Diet carbonated beverages. Sweets and Desserts  Sherbets and fruit ices. Honey, jam, marmalade, jelly, and syrups. Meringues and gelatins. Pure sugar candy, such as hard candy, jelly beans, gumdrops, mints, marshmallows, and small amounts of dark chocolate. [employer]. Eat all sweets and desserts in moderation. Fats and Oils  Nonhydrogenated (trans-free) margarines. Vegetable oils, including soybean, sesame, sunflower, olive, peanut, safflower, corn, canola, and cottonseed. Salad dressings or mayonnaise made with a vegetable oil. Limit added fats and oils that you use for cooking, baking, salads, and as spreads. Other  Cocoa powder. Coffee and tea. All seasonings and condiments. The items listed above may not be a complete list of recommended foods or beverages. Contact your dietitian for more options.  What foods are not recommended? Grains  Breads that are made with saturated or trans fats, oils, or whole milk. Croissants. Butter rolls. Cheese breads. Sweet rolls. Donuts. Buttered popcorn. Chow mein noodles. High-fat crackers, such as cheese or butter crackers. Meats and Other Protein Sources  Fatty meats, such  as hotdogs, short ribs, sausage, spareribs, bacon, rib eye roast or steak, and mutton. High-fat deli meats, such as salami and bologna. Caviar. Domestic duck and goose. Organ meats, such as kidney, liver, sweetbreads, and heart. Dairy  Cream, sour cream, cream cheese, and creamed cottage cheese. Whole-milk cheeses, including blue (bleu), [address], [place], [place], [address], [place], [address], cheddar, [place], and [place]. Whole or 2% milk that is liquid, evaporated, or condensed. Whole buttermilk. Cream sauce or high-fat cheese sauce. Yogurt that is made from whole milk. Beverages  Regular sodas and juice drinks with added sugar. Sweets and Desserts  Frosting. Pudding. Cookies. Cakes other than angel food cake. Candy that has milk chocolate or white chocolate, hydrogenated fat, butter, coconut, or unknown ingredients. Buttered syrups. Full-fat ice cream or ice cream drinks. Fats and Oils  Gravy that has suet, meat fat, or shortening. Cocoa butter, hydrogenated oils, palm oil, coconut oil, palm kernel oil. These can often be found in baked products, candy, fried foods, nondairy creamers, and whipped toppings. Solid fats and shortenings, including bacon fat, salt pork, lard, and butter. Nondairy cream substitutes, such as coffee creamers and sour cream substitutes. Salad dressings that are made of unknown oils, cheese, or sour cream. The items listed above may not be a complete list of foods and beverages to avoid. Contact  your dietitian for more information.  This information is not intended to replace advice given to you by your health care provider. Make sure you discuss any questions you have with your health care provider. Document Released: 12/11/2011 Document Revised: 11/17/2015 Document Reviewed: 12/03/2013 Elsevier Interactive Patient Education  2017 [employer].

## 2016-08-22 NOTE — Progress Notes (Signed)
Chief Complaint  Patient presents with  . Annual Exam    Subjective:  Alexander Hunter is a 66 y.o. male here for a health maintenance visit.  Patient is established pt  Erectile Dysfunction: Patient complains of erectile dysfunction.  Onset of dysfunction was 1 year ago and was gradual in onset.  Patient states the nature of difficulty is maintaining erection. Full erections occur on awakening. Partial erections occur with intercourse. Libido is affected. Risk factors for ED include cardiovascular disease. Patient denies history of diabetes mellitus and cranial, spinal, or pelvic trauma. Patient's expectations as to sexual function "he would just like to be able to last until the end".  Patient's description of relationship w/partner .  Previous treatment of ED includes trial of cialis and viagra without improvement.  He has never had testosterone levels tested   Biometrics at work 07/18/16 Total cholesterol 257 HDL 92 LDL 156 Glucose 86  Patient Active Problem List   Diagnosis Date Noted  . Bilateral inguinal hernia 06/01/2015  . Hernia of abdominal cavity 01/03/2015  . Slow transit constipation 01/03/2015  . Esophageal reflux 01/03/2015  . Physical deconditioning 01/02/2015  . STEMI (ST elevation myocardial infarction) (HCC) 01/02/2015  . CAD (coronary artery disease) S/P CABG X 4 01/02/2015  . Essential hypertension 01/02/2015  . Hyperlipidemia 01/02/2015  . Hypercholesteremia 02/18/2012  . Tobacco user 02/18/2012    Past Medical History:  Diagnosis Date  . Back pain    states has been like this for yrs  . Coronary artery disease   . History of blood transfusion 11/2014   no abnormal reaction noted  . HLD (hyperlipidemia)    takes Atorvastatin daily  . Insomnia    doesn't take any meds  . Myocardial infarction 11/2014  . Nocturia   . Pneumonia    as a child  . Shortness of breath dyspnea    rarely but notices with exertion.States doesn't happen during cardiac reheab     Past Surgical History:  Procedure Laterality Date  . CARDIAC CATHETERIZATION  12-16-14  . CORONARY ARTERY BYPASS GRAFT  11/2014   x 4-done at [facility]  . HERNIA REPAIR     as a child  . INGUINAL HERNIA REPAIR Bilateral 06/01/2015   Procedure: OPEN  BILATERAL HERNIA REPAIR;  Surgeon: De Blanch Kinsinger, MD;  Location: [facility] OR;  Service: General;  Laterality: Bilateral;  . INSERTION OF MESH Bilateral 06/01/2015   Procedure: INSERTION OF MESH;  Surgeon: Rodman Pickle, MD;  Location: [facility] OR;  Service: General;  Laterality: Bilateral;  . TONSILLECTOMY AND ADENOIDECTOMY     as a child     Outpatient Medications Prior to Visit  Medication Sig Dispense Refill  . aspirin 325 MG tablet Take 325 mg by mouth daily.    Marland Kitchen atorvastatin (LIPITOR) 40 MG tablet Take 40 mg by mouth daily.    Marland Kitchen ibuprofen (ADVIL,MOTRIN) 400 MG tablet Take 1 tablet (400 mg total) by mouth every 8 (eight) hours as needed. (Patient not taking: Reported on 08/22/2016) 30 tablet 0  . oxyCODONE (OXY IR/ROXICODONE) 5 MG immediate release tablet Take 1-2 tablets (5-10 mg total) by mouth every 4 (four) hours as needed for moderate pain. (Patient not taking: Reported on 08/22/2016) 50 tablet 0  . senna-docusate (SENOKOT-S) 8.6-50 MG tablet Take 1 tablet by mouth at bedtime as needed for mild constipation. (Patient not taking: Reported on 08/22/2016) 30 tablet 0   No facility-administered medications prior to visit.     No Known Allergies  Family History  Problem Relation Age of Onset  . Heart attack Mother   . Heart disease Mother     Heart attack  . Heart attack Father   . Heart disease Father     Heart attack  . Heart attack Maternal Grandmother   . Heart disease Maternal Grandmother     Heart attack  . Heart attack Maternal Grandfather   . Heart disease Maternal Grandfather     Heart attack     Health Habits: Dental Exam: up to date Eye Exam: up to date Exercise: 0 times/week on average Current  exercise activities: none Diet: heart  Healthy diet  Social History   Social History  . Marital status: Divorced    Spouse name: N/A  . Number of children: N/A  . Years of education: N/A   Occupational History  . Hotel manager    Social History Main Topics  . Smoking status: Former Games developer  . Smokeless tobacco: Never Used     Comment: quit smoking 12/2014  . Alcohol use 0.0 oz/week     Comment: beer 6-12 pk/wk;3-4 mixed drinks a week  . Drug use: No  . Sexual activity: Not on file   Other Topics Concern  . Not on file   Social History Narrative  . No narrative on file   History  Alcohol Use  . 0.0 oz/week    Comment: beer 6-12 pk/wk;3-4 mixed drinks a week   History  Smoking Status  . Former Smoker  Smokeless Tobacco  . Never Used    Comment: quit smoking 12/2014   History  Drug Use No     Health Maintenance: See under health Maintenance activity for review of completion dates as well. Immunization History  Administered Date(s) Administered  . PPD Test 12/30/2014  . Pneumococcal Polysaccharide-23 06/02/2015  . Tdap 06/16/2009      Depression Screen-PHQ2/9 Depression screen [facility] 2/9 08/22/2016 05/09/2015 03/11/2015 02/07/2015  Decreased Interest 0 0 0 0  Down, Depressed, Hopeless 0 0 0 0  PHQ - 2 Score 0 0 0 0      Depression Severity and Treatment Recommendations:  0-4= None  5-9= Mild / Treatment: Support, educate to call if worse; return in one month  10-14= Moderate / Treatment: Support, watchful waiting; Antidepressant or Psycotherapy  15-19= Moderately severe / Treatment: Antidepressant OR Psychotherapy  >= 20 = Major depression, severe / Antidepressant AND Psychotherapy    Review of Systems   Review of Systems  Constitutional: Negative for chills, fever, malaise/fatigue and weight loss.  HENT: Negative for congestion, ear discharge, hearing loss, nosebleeds, sinus pain and sore throat.   Respiratory: Negative for cough, shortness  of breath, wheezing and stridor.   Cardiovascular: Negative for chest pain, palpitations and orthopnea.  Gastrointestinal: Negative for abdominal pain, diarrhea, nausea and vomiting.  Genitourinary: Negative for dysuria, frequency and urgency.  Musculoskeletal: Negative for back pain, joint pain, myalgias and neck pain.  Skin: Negative for itching and rash.  Neurological: Negative for dizziness, tingling, tremors and headaches.  Psychiatric/Behavioral: Negative for depression and hallucinations. The patient is not nervous/anxious and does not have insomnia.     See HPI for ROS as well.    Objective:   Vitals:   08/22/16 0908  BP: 135/77  Pulse: 81  Resp: 18  Temp: 98.1 F (36.7 C)  TempSrc: Oral  SpO2: 96%  Weight: 173 lb (78.5 kg)  Height: 5\' 7"  (1.702 m)    Body mass index is 27.1 kg/m.  Physical Exam  Constitutional: He is oriented to person, place, and time. He appears well-developed and well-nourished.  HENT:  Head: Normocephalic.  Right Ear: External ear normal.  Left Ear: External ear normal.  Nose: Nose normal.  Mouth/Throat: Oropharynx is clear and moist.  Eyes: Conjunctivae and EOM are normal. Right eye exhibits no discharge. Left eye exhibits no discharge.  Neck: Normal range of motion. Neck supple. No thyromegaly present.  Cardiovascular: Normal rate, regular rhythm, normal heart sounds and intact distal pulses.   No murmur heard. Pulmonary/Chest: Effort normal. No respiratory distress. He has no wheezes. He has no rales.  Abdominal: Soft. Bowel sounds are normal. He exhibits no distension and no mass. There is no tenderness. There is no rebound and no guarding.  Musculoskeletal: Normal range of motion. He exhibits no edema, tenderness or deformity.  Neurological: He is alert and oriented to person, place, and time. No cranial nerve deficit.  Skin: Skin is warm. No rash noted. No erythema.  Psychiatric: He has a normal mood and affect. His behavior is  normal. Judgment and thought content normal.       Assessment/Plan:   Patient was seen for a health maintenance exam.  Counseled the patient on health maintenance issues. Reviewed her health mainteance schedule and ordered appropriate tests (see orders.) Counseled on regular exercise and weight management. Recommend regular eye exams and dental cleaning.   The following issues were addressed today for health maintenance: Prostate cancer screening Colonoscopy screening Heart disease Vision and dental exams Hepatitis C screening HIV screening  Rae was seen today for annual exam.  Diagnoses and all orders for this visit:  Annual physical exam -     POCT urinalysis dipstick -     Comprehensive metabolic panel -     Lipid panel -     TSH The following issues were addressed today for health maintenance: Prostate cancer screening Colonoscopy screening Heart disease Vision and dental exams Hepatitis C screening HIV screening  Coronary artery disease involving coronary bypass graft of native heart without angina pectoris- pt will follow up with Cardiology in April 2018 for lipitor refill.  -     Comprehensive metabolic panel -     Lipid panel -     TSH -     Cancel: Microalbumin, urine -     EKG 12-Lead  Iron deficiency anemia, unspecified iron deficiency anemia type- reviewed previous labs and pt had a history of anemia. Given his CAD and s/p MI will check to ensure his anemia is resolved -     CBC with Differential/Platelet  Need for hepatitis C screening test -     HCV Ab w/Rflx to Verification  Encounter for screening for HIV- verbal consent given -     HIV antibody  Screening for prostate cancer -     PSA  Screening for colon cancer -     [facility] COLONOSCOPY  Combined arterial insufficiency and corporo-venous occlusive erectile dysfunction- for his ED will check testosterone Will also give him increased dose of viagra and refer to Urology Discussed that there are  various options for his ED but that he should discuss with Urology the best options for him. -     TestT+TestF+SHBG -     sildenafil (VIAGRA) 100 MG tablet; Take 1/2 tablet to one tablet once a day.

## 2016-08-23 LAB — CBC WITH DIFFERENTIAL/PLATELET
Basophils Absolute: 0 10*3/uL (ref 0.0–0.2)
Basos: 1 %
EOS (ABSOLUTE): 0.3 10*3/uL (ref 0.0–0.4)
EOS: 5 %
HEMATOCRIT: 44.7 % (ref 37.5–51.0)
Hemoglobin: 14.5 g/dL (ref 13.0–17.7)
IMMATURE GRANULOCYTES: 0 %
Immature Grans (Abs): 0 10*3/uL (ref 0.0–0.1)
LYMPHS ABS: 2.4 10*3/uL (ref 0.7–3.1)
Lymphs: 41 %
MCH: 28.7 pg (ref 26.6–33.0)
MCHC: 32.4 g/dL (ref 31.5–35.7)
MCV: 89 fL (ref 79–97)
MONOS ABS: 0.5 10*3/uL (ref 0.1–0.9)
Monocytes: 9 %
NEUTROS PCT: 44 %
Neutrophils Absolute: 2.6 10*3/uL (ref 1.4–7.0)
PLATELETS: 170 10*3/uL (ref 150–379)
RBC: 5.05 x10E6/uL (ref 4.14–5.80)
RDW: 15.8 % — AB (ref 12.3–15.4)
WBC: 5.8 10*3/uL (ref 3.4–10.8)

## 2016-08-23 LAB — COMPREHENSIVE METABOLIC PANEL
ALK PHOS: 55 IU/L (ref 39–117)
ALT: 24 IU/L (ref 0–44)
AST: 22 IU/L (ref 0–40)
Albumin/Globulin Ratio: 1.4 (ref 1.2–2.2)
Albumin: 4.3 g/dL (ref 3.6–4.8)
BILIRUBIN TOTAL: 0.5 mg/dL (ref 0.0–1.2)
BUN / CREAT RATIO: 23 (ref 10–24)
BUN: 22 mg/dL (ref 8–27)
CHLORIDE: 99 mmol/L (ref 96–106)
CO2: 25 mmol/L (ref 18–29)
CREATININE: 0.97 mg/dL (ref 0.76–1.27)
Calcium: 9.7 mg/dL (ref 8.6–10.2)
GFR calc Af Amer: 96 mL/min/{1.73_m2} (ref >59)
GFR calc non Af Amer: 83 mL/min/{1.73_m2} (ref >59)
GLOBULIN, TOTAL: 3.1 g/dL (ref 1.5–4.5)
GLUCOSE: 88 mg/dL (ref 65–99)
Potassium: 4.5 mmol/L (ref 3.5–5.2)
SODIUM: 141 mmol/L (ref 134–144)
Total Protein: 7.4 g/dL (ref 6.0–8.5)

## 2016-08-23 LAB — HCV AB W/RFLX TO VERIFICATION: HCV Ab: <0.1 s/co ratio (ref 0.0–0.9)

## 2016-08-23 LAB — HIV ANTIBODY (ROUTINE TESTING W REFLEX): HIV SCREEN 4TH GENERATION: NONREACTIVE

## 2016-08-23 LAB — LIPID PANEL
CHOLESTEROL TOTAL: 252 mg/dL — AB (ref 100–199)
Chol/HDL Ratio: 3.4 ratio units (ref 0.0–5.0)
HDL: 74 mg/dL (ref >39)
LDL CALC: 163 mg/dL — AB (ref 0–99)
TRIGLYCERIDES: 73 mg/dL (ref 0–149)
VLDL Cholesterol Cal: 15 mg/dL (ref 5–40)

## 2016-08-23 LAB — HCV INTERPRETATION

## 2016-08-23 LAB — PSA: Prostate Specific Ag, Serum: 1.6 ng/mL (ref 0.0–4.0)

## 2016-08-23 LAB — TSH: TSH: 1.41 u[IU]/mL (ref 0.450–4.500)

## 2016-08-28 LAB — TESTT+TESTF+SHBG
SEX HORMONE BINDING: 58.3 nmol/L (ref 19.3–76.4)
Testosterone, Free: 14.5 pg/mL (ref 6.6–18.1)
Testosterone, total: 593.5 ng/dL (ref 264.0–916.0)

## 2016-08-29 ENCOUNTER — Encounter: Payer: Self-pay | Admitting: Family Medicine

## 2017-02-19 NOTE — Progress Notes (Deleted)
  No chief complaint on file.   HPI  Past Medical History:  Diagnosis Date  . Back pain    states has been like this for yrs  . Coronary artery disease   . History of blood transfusion 11/2014   no abnormal reaction noted  . HLD (hyperlipidemia)    takes Atorvastatin daily  . Insomnia    doesn't take any meds  . Myocardial infarction 11/2014  . Nocturia   . Pneumonia    as a child  . Shortness of breath dyspnea    rarely but notices with exertion.States doesn't happen during cardiac reheab    Current Outpatient Prescriptions  Medication Sig Dispense Refill  . aspirin 325 MG tablet Take 325 mg by mouth daily.    Marland Kitchen atorvastatin (LIPITOR) 40 MG tablet Take 40 mg by mouth daily.    . sildenafil (VIAGRA) 100 MG tablet Take 1/2 tablet to one tablet once a day. 10 tablet 0   No current facility-administered medications for this visit.     Allergies: No Known Allergies  Past Surgical History:  Procedure Laterality Date  . CARDIAC CATHETERIZATION  12-16-14  . CORONARY ARTERY BYPASS GRAFT  11/2014   x 4-done at [facility]  . HERNIA REPAIR     as a child  . INGUINAL HERNIA REPAIR Bilateral 06/01/2015   Procedure: OPEN  BILATERAL HERNIA REPAIR;  Surgeon: De Blanch Kinsinger, MD;  Location: [facility] OR;  Service: General;  Laterality: Bilateral;  . INSERTION OF MESH Bilateral 06/01/2015   Procedure: INSERTION OF MESH;  Surgeon: Rodman Pickle, MD;  Location[facility] OR;  Service: General;  Laterality: Bilateral;  . TONSILLECTOMY AND ADENOIDECTOMY     as a child    Social History   Social History  . Marital status: Divorced    Spouse name: N/A  . Number of children: N/A  . Years of education: N/A   Occupational History  . Hotel manager    Social History Main Topics  . Smoking status: Former Games developer  . Smokeless tobacco: Never Used     Comment: quit smoking 12/2014  . Alcohol use 0.0 oz/week     Comment: beer 6-12 pk/wk;3-4 mixed drinks a week  . Drug use: No  .  Sexual activity: Not on file   Other Topics Concern  . Not on file   Social History Narrative  . No narrative on file    ROS  Objective: There were no vitals filed for this visit.  Physical Exam  Assessment and Plan There are no diagnoses linked to this encounter.   Alexander Hunter [employer]

## 2017-02-20 ENCOUNTER — Ambulatory Visit: Payer: 59 | Admitting: Family Medicine

## 2017-10-28 NOTE — Progress Notes (Signed)
Chief Complaint  Patient presents with  . Joint Swelling    left ankle swelling with pain, pain level 8/10 upon waking up and standing, and more painful with standing, walking or being on feet for hours.  Not taking  any otc medications for the pain.  Per pt alots of stiffness.    HPI  Patient reports that he has been having intermittent foot pain 8/10 States that the pain is with walking and standing Onset was 2 months ago States that he was using rug doctor machine and put some stress on the ankle States that he has been standing at work and walking for hours He does not take any otc medication for the pain The pain is associated with stiffness Occasionally swelling    Past Medical History:  Diagnosis Date  . Back pain    states has been like this for yrs  . Coronary artery disease   . History of blood transfusion 11/2014   no abnormal reaction noted  . HLD (hyperlipidemia)    takes Atorvastatin daily  . Insomnia    doesn't take any meds  . Myocardial infarction (HCC) 11/2014  . Nocturia   . Pneumonia    as a child  . Shortness of breath dyspnea    rarely but notices with exertion.States doesn't happen during cardiac reheab    Current Outpatient Medications  Medication Sig Dispense Refill  . aspirin 325 MG tablet Take 325 mg by mouth daily.    Marland Kitchen atorvastatin (LIPITOR) 40 MG tablet Take 40 mg by mouth daily.    . sildenafil (VIAGRA) 100 MG tablet Take 1/2 tablet to one tablet once a day. (Patient not taking: Reported on 10/30/2017) 10 tablet 0   No current facility-administered medications for this visit.     Allergies: No Known Allergies  Past Surgical History:  Procedure Laterality Date  . CARDIAC CATHETERIZATION  12-16-14  . CORONARY ARTERY BYPASS GRAFT  11/2014   x 4-done at [facility]  . HERNIA REPAIR     as a child  . INGUINAL HERNIA REPAIR Bilateral 06/01/2015   Procedure: OPEN  BILATERAL HERNIA REPAIR;  Surgeon: De Blanch Kinsinger, MD;  Location: [facility]  OR;  Service: General;  Laterality: Bilateral;  . INSERTION OF MESH Bilateral 06/01/2015   Procedure: INSERTION OF MESH;  Surgeon: Rodman Pickle, MD;  Location: [facility] OR;  Service: General;  Laterality: Bilateral;  . TONSILLECTOMY AND ADENOIDECTOMY     as a child    Social History   Socioeconomic History  . Marital status: Divorced    Spouse name: Not on file  . Number of children: Not on file  . Years of education: Not on file  . Highest education level: Not on file  Occupational History  . Occupation: Hotel manager  Social Needs  . Financial resource strain: Not on file  . Food insecurity:    Worry: Not on file    Inability: Not on file  . Transportation needs:    Medical: Not on file    Non-medical: Not on file  Tobacco Use  . Smoking status: Former Games developer  . Smokeless tobacco: Never Used  . Tobacco comment: quit smoking 12/2014  Substance and Sexual Activity  . Alcohol use: Yes    Alcohol/week: 0.0 oz    Comment: beer 6-12 pk/wk;3-4 mixed drinks a week  . Drug use: No  . Sexual activity: Not on file  Lifestyle  . Physical activity:    Days per week: Not on  file    Minutes per session: Not on file  . Stress: Not on file  Relationships  . Social connections:    Talks on phone: Not on file    Gets together: Not on file    Attends religious service: Not on file    Active member of club or organization: Not on file    Attends meetings of clubs or organizations: Not on file    Relationship status: Not on file  Other Topics Concern  . Not on file  Social History Narrative  . Not on file    Family History  Problem Relation Age of Onset  . Heart attack Mother   . Heart disease Mother        Heart attack  . Heart attack Father   . Heart disease Father        Heart attack  . Heart attack Maternal Grandmother   . Heart disease Maternal Grandmother        Heart attack  . Heart attack Maternal Grandfather   . Heart disease Maternal Grandfather         Heart attack     ROS Review of Systems See HPI Constitution: No fevers or chills No malaise No diaphoresis Skin: No rash or itching Eyes: no blurry vision, no double vision GU: no dysuria or hematuria Neuro: no dizziness or headaches all others reviewed and negative   Objective: Vitals:   10/30/17 0819  BP: 133/83  Pulse: 87  Resp: 16  Temp: 98.5 F (36.9 C)  TempSrc: Oral  SpO2: 98%  Weight: 167 lb 6.4 oz (75.9 kg)  Height: 5\' 7"  (1.702 m)    Physical Exam  Constitutional: He is oriented to person, place, and time. He appears well-developed and well-nourished.  HENT:  Head: Normocephalic and atraumatic.  Eyes: Conjunctivae and EOM are normal.  Neck: Normal range of motion. Neck supple.  Pulmonary/Chest: Effort normal.  Musculoskeletal:       Left foot: There is tenderness. There is normal range of motion, no bony tenderness, no swelling, normal capillary refill, no crepitus, no deformity and no laceration.       Feet:  Neurological: He is alert and oriented to person, place, and time.  Skin: Skin is warm. Capillary refill takes less than 2 seconds.  Psychiatric: He has a normal mood and affect. His behavior is normal. Judgment and thought content normal.    Ankle mortice non-tender  Non-tender over the achilles or the calf   EXAM: LEFT FOOT - COMPLETE 3+ VIEW  COMPARISON:  None.  FINDINGS: Normal alignment. No acute osseous finding, fracture, or joint abnormality of the foot. No definite soft tissue abnormality. Degenerative arthropathy of the left ankle joint with joint space loss, sclerosis bony spurring of the tibiotalar joint. Peripheral atherosclerosis noted on the lateral view.  IMPRESSION: No acute osseous finding  Moderate left ankle joint arthropathy  Peripheral atherosclerosis   Electronically Signed   By: Judie Petit.  Shick M.D.   On: 10/30/2017 09:13   Assessment and Plan Lymon was seen today for joint swelling.  Diagnoses and  all orders for this visit:  Left foot pain -     DG Foot Complete Left; Future -     Ambulatory referral to Podiatry   Would advise pt to follow up with Podiatry for evaluation and treatment Referral placed    Latashia Koch A Martia Dalby

## 2017-10-30 ENCOUNTER — Ambulatory Visit (INDEPENDENT_AMBULATORY_CARE_PROVIDER_SITE_OTHER): Payer: 59 | Admitting: Family Medicine

## 2017-10-30 ENCOUNTER — Other Ambulatory Visit: Payer: Self-pay

## 2017-10-30 ENCOUNTER — Ambulatory Visit (INDEPENDENT_AMBULATORY_CARE_PROVIDER_SITE_OTHER): Payer: 59

## 2017-10-30 ENCOUNTER — Encounter: Payer: Self-pay | Admitting: Family Medicine

## 2017-10-30 VITALS — BP 133/83 | HR 87 | Temp 98.5°F | Resp 16 | Ht 67.0 in | Wt 167.4 lb

## 2017-10-30 DIAGNOSIS — M79672 Pain in left foot: Secondary | ICD-10-CM

## 2017-10-30 NOTE — Patient Instructions (Addendum)
     IF you received an x-ray today, you will receive an invoice from [facility]. Please contact [facility] at [phone] with questions or concerns regarding your invoice.   IF you received labwork today, you will receive an invoice from [facility]. Please contact [facility] at [phone] with questions or concerns regarding your invoice.   Our billing staff will not be able to assist you with questions regarding bills from these companies.  You will be contacted with the lab results as soon as they are available. The fastest way to get your results is to activate your My Chart account. Instructions are located on the last page of this paperwork. If you have not heard from us regarding the results in 2 weeks, please contact this office.     Foot Pain Many things can cause foot pain. Some common causes are:  An injury.  A sprain.  Arthritis.  Blisters.  Bunions.  Follow these instructions at home: Pay attention to any changes in your symptoms. Take these actions to help with your discomfort:  If directed, put ice on the affected area: ? Put ice in a plastic bag. ? Place a towel between your skin and the bag. ? Leave the ice on for 15-20 minutes, 3?4 times a day for 2 days.  Take over-the-counter and prescription medicines only as told by your health care provider.  Wear comfortable, supportive shoes that fit you well. Do not wear high heels.  Do not stand or walk for long periods of time.  Do not lift a lot of weight. This can put added pressure on your feet.  Do stretches to relieve foot pain and stiffness as told by your health care provider.  Rub your foot gently.  Keep your feet clean and dry.  Contact a health care provider if:  Your pain does not get better after a few days of self-care.  Your pain gets worse.  You cannot stand on your foot. Get help right away if:  Your foot is numb or tingling.  Your foot or toes are  swollen.  Your foot or toes turn white or blue.  You have warmth and redness along your foot. This information is not intended to replace advice given to you by your health care provider. Make sure you discuss any questions you have with your health care provider. Document Released: 07/08/2015 Document Revised: 11/17/2015 Document Reviewed: 07/07/2014 Elsevier Interactive Patient Education  2018 [employer] Inc[employer]  

## 2017-11-08 ENCOUNTER — Encounter: Payer: Self-pay | Admitting: Podiatry

## 2017-11-08 ENCOUNTER — Other Ambulatory Visit: Payer: Self-pay | Admitting: Podiatry

## 2017-11-08 ENCOUNTER — Ambulatory Visit (INDEPENDENT_AMBULATORY_CARE_PROVIDER_SITE_OTHER): Payer: 59 | Admitting: Podiatry

## 2017-11-08 ENCOUNTER — Other Ambulatory Visit: Payer: Self-pay

## 2017-11-08 ENCOUNTER — Ambulatory Visit (INDEPENDENT_AMBULATORY_CARE_PROVIDER_SITE_OTHER): Payer: 59

## 2017-11-08 VITALS — BP 126/80 | HR 69

## 2017-11-08 DIAGNOSIS — M79672 Pain in left foot: Secondary | ICD-10-CM

## 2017-11-08 DIAGNOSIS — M2141 Flat foot [pes planus] (acquired), right foot: Secondary | ICD-10-CM

## 2017-11-08 DIAGNOSIS — M2142 Flat foot [pes planus] (acquired), left foot: Secondary | ICD-10-CM

## 2017-11-08 DIAGNOSIS — M25572 Pain in left ankle and joints of left foot: Secondary | ICD-10-CM

## 2017-11-08 DIAGNOSIS — M779 Enthesopathy, unspecified: Secondary | ICD-10-CM

## 2017-11-08 DIAGNOSIS — M7752 Other enthesopathy of left foot: Secondary | ICD-10-CM

## 2017-11-08 MED ORDER — TRIAMCINOLONE ACETONIDE 10 MG/ML IJ SUSP
10.0000 mg | Freq: Once | INTRAMUSCULAR | Status: AC
  Administered 2017-11-08: 10 mg

## 2017-11-08 MED ORDER — DICLOFENAC SODIUM 75 MG PO TBEC: 75.0000 mg | DELAYED_RELEASE_TABLET | Freq: Two times a day (BID) | ORAL | 2 refills | Status: DC

## 2017-11-10 NOTE — Progress Notes (Signed)
Subjective:   Patient ID: Alexander Hunter, male   DOB: 67 y.o.   MRN: [id number]   HPI patient states has had a lot of pain in the left ankle over the last few months and does not remember specific injury.  States is worse when she stands on it or she wears any type of flat shoes and patient does not smoke and likes to be active   Review of Systems  All other systems reviewed and are negative.       Objective:  Physical Exam  Constitutional: He appears well-developed and well-nourished.  Cardiovascular: Intact distal pulses.  Pulmonary/Chest: Effort normal.  Musculoskeletal: Normal range of motion.  Neurological: He is alert.  Skin: Skin is warm.  Nursing note and vitals reviewed.   Neurovascular status intact muscle strength adequate range of motion within normal limits with patient found to have exquisite discomfort in the medial side of the left ankle with inflammation at the posterior tibial insertion into the navicular.  Patient has good digital perfusion is well oriented x3 and has no indication of tendon or muscle dysfunction     Assessment:  Posterior tibial tendinitis with moderate depression of the arch is complicating factor     Plan:  H&P condition reviewed and careful sheath injection administered 3 mg Kenalog 5 mg Xylocaine after explaining risk.  I then applied fascial brace to lift the arch instructed on ice therapy reduced activity and reappoint for [place] to recheck  X-ray indicates that there is moderate depression of the arch but no indications of other pathology

## 2017-11-22 ENCOUNTER — Telehealth: Payer: Self-pay | Admitting: Podiatry

## 2017-11-22 ENCOUNTER — Encounter: Payer: Self-pay | Admitting: Podiatry

## 2017-11-22 ENCOUNTER — Ambulatory Visit (INDEPENDENT_AMBULATORY_CARE_PROVIDER_SITE_OTHER): Payer: 59 | Admitting: Podiatry

## 2017-11-22 DIAGNOSIS — M76821 Posterior tibial tendinitis, right leg: Secondary | ICD-10-CM

## 2017-11-22 DIAGNOSIS — M76822 Posterior tibial tendinitis, left leg: Secondary | ICD-10-CM | POA: Diagnosis not present

## 2017-11-22 NOTE — Telephone Encounter (Signed)
Left message for pt to call to discuss orthotic coverage..orthotics are excluded from plan. Asked him to call to confirm he would like to proceed with getting orthotics for 398.00

## 2017-11-22 NOTE — Progress Notes (Signed)
Subjective:   Patient ID: Alexander Hunter, male   DOB: 67 y.o.   MRN: [id number]   HPI patient presents doing very well but feels like he needs something to support his arches as he has had chronic trouble and works on cement floors   ROS      Objective:  Physical Exam  Neurovascular status intact with inflammation around the posterior tibial insertion left with moderate depression of the arch noted     Assessment:  Improvement posterior tibial tendinitis left with functional structural problems still present     Plan:  Reviewed condition and discussed continued physical therapy ice therapy support shoes and did scan him for custom orthotics to lift the arch.  Patient will be seen back by ped orthotist when they are ready

## 2017-11-26 NOTE — Telephone Encounter (Signed)
Called pt back due to possible vm he left but I could not understand the name on the voicemail and number was not left. Pt wants to proceed with the orthotics and is aware they are not covered by his insurance. He is aware we need at least half down when picking up.

## 2017-12-17 ENCOUNTER — Ambulatory Visit (INDEPENDENT_AMBULATORY_CARE_PROVIDER_SITE_OTHER): Payer: 59 | Admitting: Orthotics

## 2017-12-17 DIAGNOSIS — M2142 Flat foot [pes planus] (acquired), left foot: Secondary | ICD-10-CM

## 2017-12-17 DIAGNOSIS — M76822 Posterior tibial tendinitis, left leg: Secondary | ICD-10-CM

## 2017-12-17 DIAGNOSIS — M779 Enthesopathy, unspecified: Secondary | ICD-10-CM

## 2017-12-17 DIAGNOSIS — M2141 Flat foot [pes planus] (acquired), right foot: Secondary | ICD-10-CM

## 2017-12-17 NOTE — Progress Notes (Signed)
Patient came in today to pick up custom made foot orthotics.  The goals were accomplished and the patient reported no dissatisfaction with said orthotics.  Patient was advised of breakin period and how to report any issues. 

## 2018-02-18 ENCOUNTER — Ambulatory Visit: Payer: 59 | Admitting: Orthotics

## 2018-02-18 DIAGNOSIS — M779 Enthesopathy, unspecified: Secondary | ICD-10-CM

## 2018-02-18 NOTE — Progress Notes (Signed)
needds adjustment:  Lower arch b/l,  5th met head cut out b/l.   Pad 68m PPT, 1.5 mm spenco entire device.

## 2018-03-04 ENCOUNTER — Ambulatory Visit: Payer: 59 | Admitting: Orthotics

## 2018-03-04 DIAGNOSIS — M76822 Posterior tibial tendinitis, left leg: Secondary | ICD-10-CM

## 2018-03-04 DIAGNOSIS — M2141 Flat foot [pes planus] (acquired), right foot: Secondary | ICD-10-CM

## 2018-03-04 DIAGNOSIS — M779 Enthesopathy, unspecified: Secondary | ICD-10-CM

## 2018-03-04 DIAGNOSIS — M2142 Flat foot [pes planus] (acquired), left foot: Secondary | ICD-10-CM

## 2018-03-04 DIAGNOSIS — M79672 Pain in left foot: Secondary | ICD-10-CM

## 2018-03-04 NOTE — Progress Notes (Signed)
Patient came in today to pick up custom made foot orthotics.  The goals were accomplished and the patient reported no dissatisfaction with said orthotics.  Patient was advised of breakin period and how to report any issues. 

## 2018-10-08 IMAGING — DX DG FOOT COMPLETE 3+V*L*
3 series · 3 of 3 positions shown · non-contrast
Comparison: None.

CLINICAL DATA: Left foot pain

EXAM:
LEFT FOOT - COMPLETE 3+ VIEW

[foot ap]
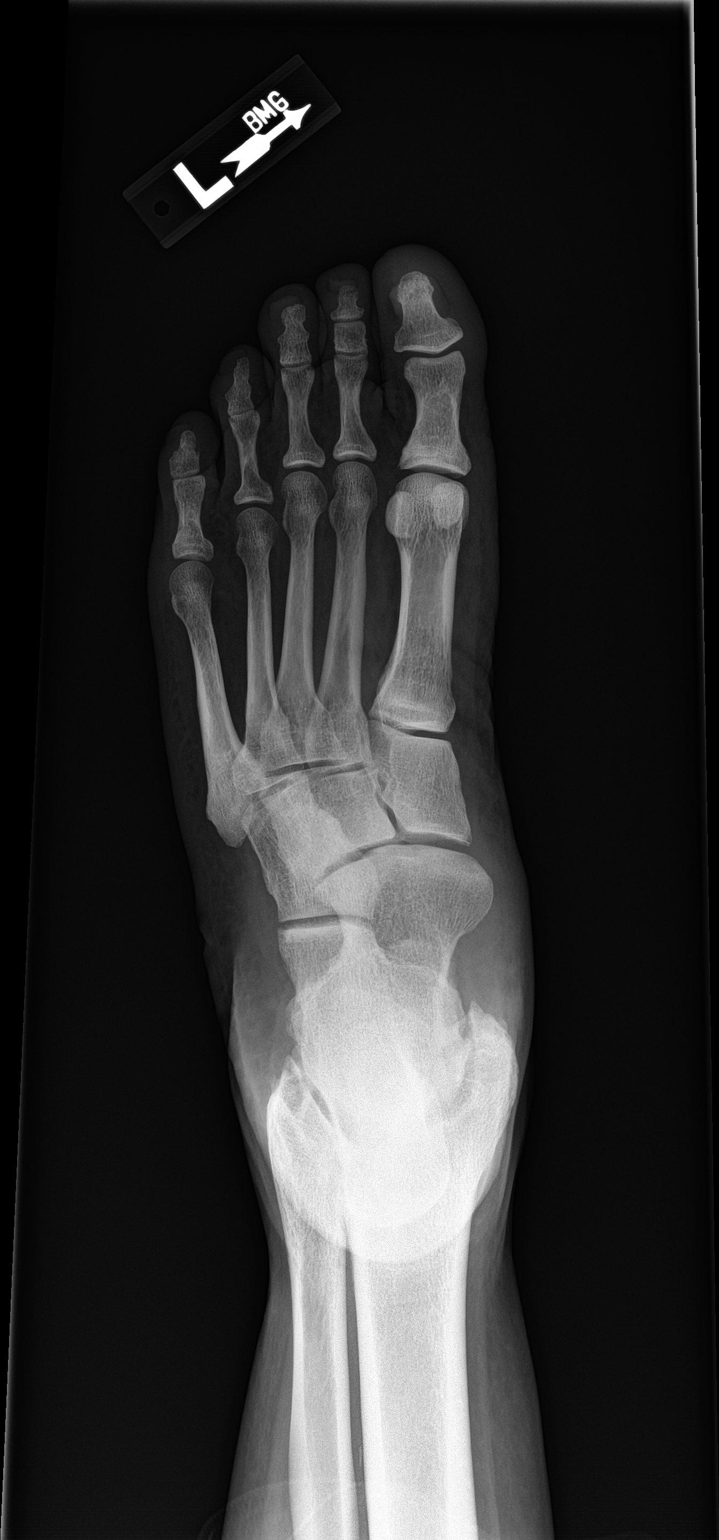

[foot obl]
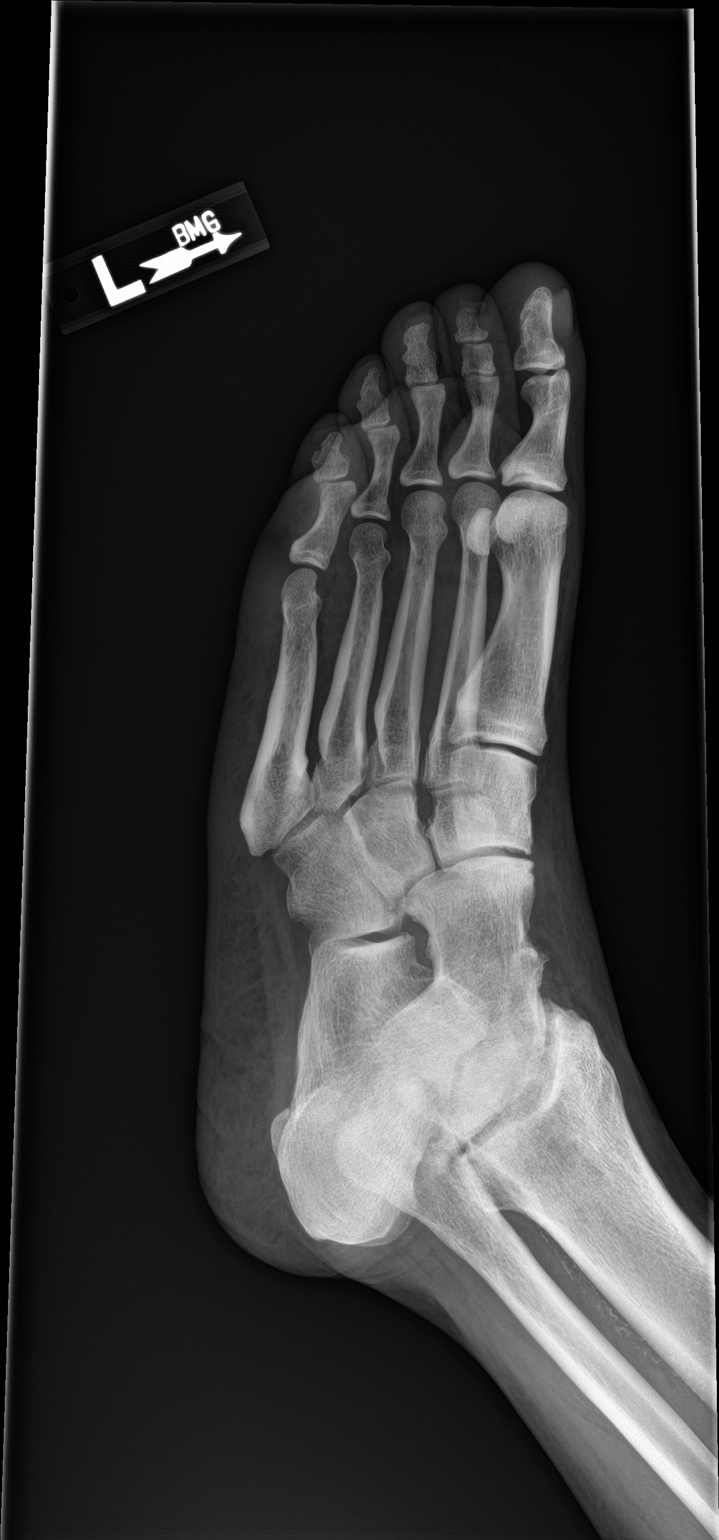

[foot lat]
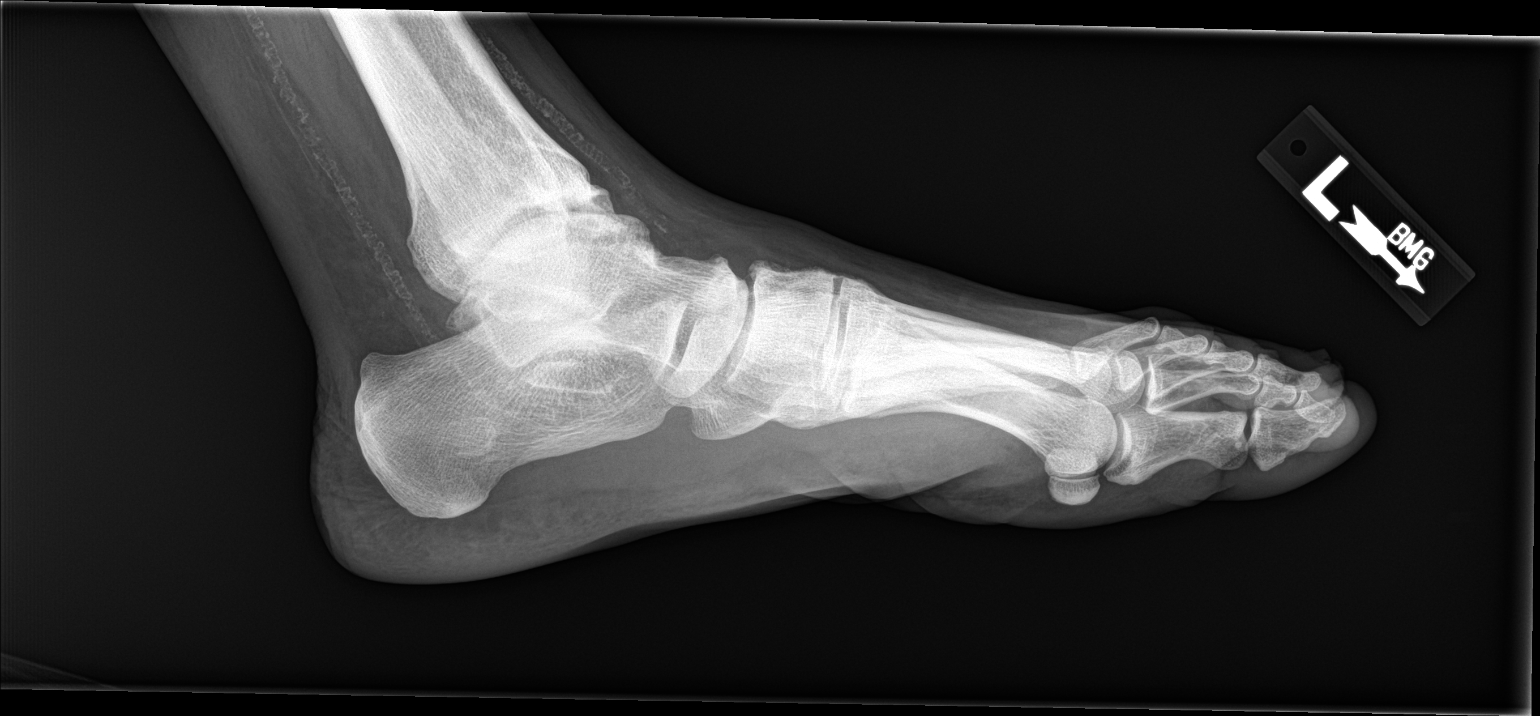

[3 of 3 positions shown; findings below may reference images not displayed]

FINDINGS: Normal alignment. No acute osseous finding, fracture, or joint
abnormality of the foot. No definite soft tissue abnormality.
Degenerative arthropathy of the left ankle joint with joint space
loss, sclerosis bony spurring of the tibiotalar joint. Peripheral
atherosclerosis noted on the lateral view.
IMPRESSION: No acute osseous finding

Moderate left ankle joint arthropathy

Peripheral atherosclerosis

## 2019-01-21 ENCOUNTER — Encounter: Payer: Self-pay | Admitting: Podiatry

## 2019-01-21 ENCOUNTER — Ambulatory Visit (INDEPENDENT_AMBULATORY_CARE_PROVIDER_SITE_OTHER): Payer: 59 | Admitting: Podiatry

## 2019-01-21 ENCOUNTER — Ambulatory Visit (INDEPENDENT_AMBULATORY_CARE_PROVIDER_SITE_OTHER): Payer: 59

## 2019-01-21 ENCOUNTER — Other Ambulatory Visit: Payer: Self-pay | Admitting: Podiatry

## 2019-01-21 ENCOUNTER — Other Ambulatory Visit: Payer: Self-pay

## 2019-01-21 VITALS — Temp 97.9°F

## 2019-01-21 DIAGNOSIS — M779 Enthesopathy, unspecified: Secondary | ICD-10-CM

## 2019-01-21 DIAGNOSIS — M79671 Pain in right foot: Secondary | ICD-10-CM

## 2019-01-21 DIAGNOSIS — M7751 Other enthesopathy of right foot: Secondary | ICD-10-CM

## 2019-01-21 DIAGNOSIS — M79672 Pain in left foot: Secondary | ICD-10-CM

## 2019-01-22 NOTE — Progress Notes (Signed)
Subjective:   Patient ID: Alexander Hunter, male   DOB: 68 y.o.   MRN: [id number]   HPI Patient presents with pain in the outside of his right foot and the left arch.  He does work on [employer] and 12-hour days and states he spends a lot of time walking on his foot    ROS      Objective:  Physical Exam  Neurovascular status was found to be intact with patient found to have quite a bit of inflammation sub-fifth metatarsal base right with fluid buildup around the joint and keratotic lesion and inflammation into the left arch     Assessment:  Acute capsulitis of the right fifth metatarsal base with fluid buildup along with left arch pain     Plan:  8 H&P conditions reviewed and I went ahead today and I did sterile prep and careful injection to the base of the fifth metatarsal to the plantar capsule 3 mg Dexasone Kenalog 5 mg Xylocaine and instructed on reduced activity and reappoint to recheck depending on symptoms  X-rays indicate that there is no indication to stress fracture arthritis and it appears to be an acute soft tissue pathology

## 2019-02-03 ENCOUNTER — Other Ambulatory Visit: Payer: Self-pay

## 2019-02-03 ENCOUNTER — Ambulatory Visit: Payer: 59 | Admitting: Orthotics

## 2019-02-03 DIAGNOSIS — M779 Enthesopathy, unspecified: Secondary | ICD-10-CM

## 2019-02-03 DIAGNOSIS — M2141 Flat foot [pes planus] (acquired), right foot: Secondary | ICD-10-CM

## 2019-02-03 DIAGNOSIS — M76822 Posterior tibial tendinitis, left leg: Secondary | ICD-10-CM

## 2019-02-03 DIAGNOSIS — M79671 Pain in right foot: Secondary | ICD-10-CM

## 2019-02-03 NOTE — Progress Notes (Signed)
REFURBISH F/O with 5th met base offload and valgus RF/FF wedge

## 2019-02-11 ENCOUNTER — Encounter: Payer: Self-pay | Admitting: Podiatry

## 2019-02-11 ENCOUNTER — Ambulatory Visit (INDEPENDENT_AMBULATORY_CARE_PROVIDER_SITE_OTHER): Payer: 59 | Admitting: Podiatry

## 2019-02-11 ENCOUNTER — Other Ambulatory Visit: Payer: Self-pay

## 2019-02-11 VITALS — Temp 97.3°F

## 2019-02-11 DIAGNOSIS — M79671 Pain in right foot: Secondary | ICD-10-CM

## 2019-02-11 DIAGNOSIS — M779 Enthesopathy, unspecified: Secondary | ICD-10-CM

## 2019-02-11 NOTE — Progress Notes (Signed)
Subjective:   Patient ID: Alexander Hunter, male   DOB: 68 y.o.   MRN: [id number]   HPI Patient presents stating my foot feels the same and eventually might want to do something with it but I am trying to work on orthotics first    ROS      Objective:  Physical Exam  Neurovascular status intact muscle strength adequate with patient found to have a area of chronic keratotic lesion around the fifth metatarsal base right that is painful when pressed and makes walking difficult at times    Assessment:  Chronic lesion formation with possibility for hypertrophied bone in the area     Plan:  H&P discussed condition and recommended orthotic to be changed to try to take pressure off the lateral side of the foot and ultimately bone shaving with possible excision of lesion which I educated him on today but I want to hold off on this and try other things first

## 2019-02-24 ENCOUNTER — Ambulatory Visit: Payer: Self-pay | Admitting: Orthotics

## 2019-02-24 ENCOUNTER — Other Ambulatory Visit: Payer: Self-pay

## 2019-02-24 DIAGNOSIS — M76822 Posterior tibial tendinitis, left leg: Secondary | ICD-10-CM

## 2019-02-24 DIAGNOSIS — M779 Enthesopathy, unspecified: Secondary | ICD-10-CM

## 2019-02-24 DIAGNOSIS — M2141 Flat foot [pes planus] (acquired), right foot: Secondary | ICD-10-CM

## 2019-02-24 DIAGNOSIS — M2142 Flat foot [pes planus] (acquired), left foot: Secondary | ICD-10-CM

## 2019-02-24 DIAGNOSIS — M79671 Pain in right foot: Secondary | ICD-10-CM

## 2019-02-24 DIAGNOSIS — M79676 Pain in unspecified toe(s): Secondary | ICD-10-CM

## 2019-02-24 NOTE — Progress Notes (Signed)
Picked up f/o refurbished.

## 2019-02-26 ENCOUNTER — Telehealth: Payer: Self-pay | Admitting: Podiatry

## 2019-02-26 NOTE — Telephone Encounter (Signed)
Patient came into the office and requests a refill of VOLTAREN be sent to his pharmacy--[facility] on [address]. Pt sees Dr. Paulla Dolly, please advise.Marland KitchenMarland Kitchen

## 2019-02-26 NOTE — Telephone Encounter (Signed)
Fine for one month

## 2019-02-27 MED ORDER — DICLOFENAC SODIUM 75 MG PO TBEC: 75.0000 mg | DELAYED_RELEASE_TABLET | Freq: Two times a day (BID) | ORAL | 0 refills | Status: DC

## 2019-02-27 NOTE — Telephone Encounter (Signed)
I left message informing pt the medication had been call to his pharmacy and he would need an appt prior to future refills.

## 2019-02-27 NOTE — Addendum Note (Signed)
Addended by: Harriett Sine D on: 02/27/2019 07:56 AM   Modules accepted: Orders

## 2019-05-13 ENCOUNTER — Ambulatory Visit (INDEPENDENT_AMBULATORY_CARE_PROVIDER_SITE_OTHER): Payer: 59 | Admitting: Registered Nurse

## 2019-05-13 ENCOUNTER — Encounter: Payer: Self-pay | Admitting: Registered Nurse

## 2019-05-13 ENCOUNTER — Other Ambulatory Visit: Payer: Self-pay

## 2019-05-13 VITALS — BP 118/70 | HR 87 | Temp 98.0°F | Resp 16 | Ht 67.32 in | Wt 145.0 lb

## 2019-05-13 DIAGNOSIS — Z0000 Encounter for general adult medical examination without abnormal findings: Secondary | ICD-10-CM

## 2019-05-13 DIAGNOSIS — Z1329 Encounter for screening for other suspected endocrine disorder: Secondary | ICD-10-CM

## 2019-05-13 DIAGNOSIS — M25571 Pain in right ankle and joints of right foot: Secondary | ICD-10-CM

## 2019-05-13 DIAGNOSIS — Z0001 Encounter for general adult medical examination with abnormal findings: Secondary | ICD-10-CM | POA: Diagnosis not present

## 2019-05-13 DIAGNOSIS — Z13220 Encounter for screening for lipoid disorders: Secondary | ICD-10-CM | POA: Diagnosis not present

## 2019-05-13 DIAGNOSIS — Z23 Encounter for immunization: Secondary | ICD-10-CM

## 2019-05-13 DIAGNOSIS — Z125 Encounter for screening for malignant neoplasm of prostate: Secondary | ICD-10-CM

## 2019-05-13 DIAGNOSIS — Z130 Encounter for screening for diseases of the blood and blood-forming organs and certain disorders involving the immune mechanism: Secondary | ICD-10-CM

## 2019-05-13 DIAGNOSIS — E7800 Pure hypercholesterolemia, unspecified: Secondary | ICD-10-CM

## 2019-05-13 DIAGNOSIS — Z13228 Encounter for screening for other metabolic disorders: Secondary | ICD-10-CM

## 2019-05-13 MED ORDER — ATORVASTATIN CALCIUM 80 MG PO TABS: ORAL_TABLET | ORAL | 3 refills | Status: DC

## 2019-05-13 MED ORDER — DICLOFENAC SODIUM 1 % EX GEL: 2.0000 g | Freq: Two times a day (BID) | CUTANEOUS | 1 refills | Status: DC

## 2019-05-13 NOTE — Patient Instructions (Addendum)
   If you have lab work done today you will be contacted with your lab results within the next 2 weeks.  If you have not heard from us then please contact us. The fastest way to get your results is to register for My Chart.   IF you received an x-ray today, you will receive an invoice from [facility]. Please contact [facility] at [phone] with questions or concerns regarding your invoice.   IF you received labwork today, you will receive an invoice from [facility]. Please contact [facility] at [phone] with questions or concerns regarding your invoice.   Our billing staff will not be able to assist you with questions regarding bills from these companies.  You will be contacted with the lab results as soon as they are available. The fastest way to get your results is to activate your My Chart account. Instructions are located on the last page of this paperwork. If you have not heard from us regarding the results in 2 weeks, please contact this office.       Health Maintenance, Male Adopting a healthy lifestyle and getting preventive care are important in promoting health and wellness. Ask your health care provider about:  The right schedule for you to have regular tests and exams.  Things you can do on your own to prevent diseases and keep yourself healthy. What should I know about diet, weight, and exercise? Eat a healthy diet   Eat a diet that includes plenty of vegetables, fruits, low-fat dairy products, and lean protein.  Do not eat a lot of foods that are high in solid fats, added sugars, or sodium. Maintain a healthy weight Body mass index (BMI) is a measurement that can be used to identify possible weight problems. It estimates body fat based on height and weight. Your health care provider can help determine your BMI and help you achieve or maintain a healthy weight. Get regular exercise Get regular exercise. This is one of the most important things  you can do for your health. Most adults should:  Exercise for at least 150 minutes each week. The exercise should increase your heart rate and make you sweat (moderate-intensity exercise).  Do strengthening exercises at least twice a week. This is in addition to the moderate-intensity exercise.  Spend less time sitting. Even light physical activity can be beneficial. Watch cholesterol and blood lipids Have your blood tested for lipids and cholesterol at 66 years of age, then have this test every 5 years. You may need to have your cholesterol levels checked more often if:  Your lipid or cholesterol levels are high.  You are older than 66 years of age.  You are at high risk for heart disease. What should I know about cancer screening? Many types of cancers can be detected early and may often be prevented. Depending on your health history and family history, you may need to have cancer screening at various ages. This may include screening for:  Colorectal cancer.  Prostate cancer.  Skin cancer.  Lung cancer. What should I know about heart disease, diabetes, and high blood pressure? Blood pressure and heart disease  High blood pressure causes heart disease and increases the risk of stroke. This is more likely to develop in people who have high blood pressure readings, are of African descent, or are overweight.  Talk with your health care provider about your target blood pressure readings.  Have your blood pressure checked: ? Every 3-5 years if you are   18-39 years of age. ? Every year if you are 40 years old or older.  If you are between the ages of 65 and 75 and are a current or former smoker, ask your health care provider if you should have a one-time screening for abdominal aortic aneurysm (AAA). Diabetes Have regular diabetes screenings. This checks your fasting blood sugar level. Have the screening done:  Once every three years after age 45 if you are at a normal weight and  have a low risk for diabetes.  More often and at a younger age if you are overweight or have a high risk for diabetes. What should I know about preventing infection? Hepatitis B If you have a higher risk for hepatitis B, you should be screened for this virus. Talk with your health care provider to find out if you are at risk for hepatitis B infection. Hepatitis C Blood testing is recommended for:  Everyone born from 1945 through 1965.  Anyone with known risk factors for hepatitis C. Sexually transmitted infections (STIs)  You should be screened each year for STIs, including gonorrhea and chlamydia, if: ? You are sexually active and are younger than 66 years of age. ? You are older than 66 years of age and your health care provider tells you that you are at risk for this type of infection. ? Your sexual activity has changed since you were last screened, and you are at increased risk for chlamydia or gonorrhea. Ask your health care provider if you are at risk.  Ask your health care provider about whether you are at high risk for HIV. Your health care provider may recommend a prescription medicine to help prevent HIV infection. If you choose to take medicine to prevent HIV, you should first get tested for HIV. You should then be tested every 3 months for as long as you are taking the medicine. Follow these instructions at home: Lifestyle  Do not use any products that contain nicotine or tobacco, such as cigarettes, e-cigarettes, and chewing tobacco. If you need help quitting, ask your health care provider.  Do not use street drugs.  Do not share needles.  Ask your health care provider for help if you need support or information about quitting drugs. Alcohol use  Do not drink alcohol if your health care provider tells you not to drink.  If you drink alcohol: ? Limit how much you have to 0-2 drinks a day. ? Be aware of how much alcohol is in your drink. In the [place] one drink equals  one 12 oz bottle of beer (355 mL), one 5 oz glass of wine (148 mL), or one 1 oz glass of hard liquor (44 mL). General instructions  Schedule regular health, dental, and eye exams.  Stay current with your vaccines.  Tell your health care provider if: ? You often feel depressed. ? You have ever been abused or do not feel safe at home. Summary  Adopting a healthy lifestyle and getting preventive care are important in promoting health and wellness.  Follow your health care provider's instructions about healthy diet, exercising, and getting tested or screened for diseases.  Follow your health care provider's instructions on monitoring your cholesterol and blood pressure. This information is not intended to replace advice given to you by your health care provider. Make sure you discuss any questions you have with your health care provider. Document Released: 12/08/2007 Document Revised: 06/04/2018 Document Reviewed: 06/04/2018 Elsevier Patient Education  2020 [employer] Inc[employer]       Why follow it? Research shows. . Those who follow the Mediterranean diet have a reduced risk of heart disease  . The diet is associated with a reduced incidence of Parkinson's and Alzheimer's diseases . People following the diet may have longer life expectancies and lower rates of chronic diseases  . The Dietary Guidelines for Americans recommends the Mediterranean diet as an eating plan to promote health and prevent disease  What Is the Mediterranean Diet?  . Healthy eating plan based on typical foods and recipes of Mediterranean-style cooking . The diet is primarily a plant based diet; these foods should make up a majority of meals   Starches - Plant based foods should make up a majority of meals - They are an important sources of vitamins, minerals, energy, antioxidants, and fiber - Choose whole grains, foods high in fiber and minimally processed items  - Typical grain sources include wheat, oats, barley, corn,  brown rice, bulgar, farro, millet, polenta, couscous  - Various types of beans include chickpeas, lentils, fava beans, black beans, white beans   Fruits  Veggies - Large quantities of antioxidant rich fruits & veggies; 6 or more servings  - Vegetables can be eaten raw or lightly drizzled with oil and cooked  - Vegetables common to the traditional Mediterranean Diet include: artichokes, arugula, beets, broccoli, brussel sprouts, cabbage, carrots, celery, collard greens, cucumbers, eggplant, kale, leeks, lemons, lettuce, mushrooms, okra, onions, peas, peppers, potatoes, pumpkin, radishes, rutabaga, shallots, spinach, sweet potatoes, turnips, zucchini - Fruits common to the Mediterranean Diet include: apples, apricots, avocados, cherries, clementines, dates, figs, grapefruits, grapes, melons, nectarines, oranges, peaches, pears, pomegranates, strawberries, tangerines  Fats - Replace butter and margarine with healthy oils, such as olive oil, canola oil, and tahini  - Limit nuts to no more than a handful a day  - Nuts include walnuts, almonds, pecans, pistachios, pine nuts  - Limit or avoid candied, honey roasted or heavily salted nuts - Olives are central to the Mediterranean diet - can be eaten whole or used in a variety of dishes   Meats Protein - Limiting red meat: no more than a few times a month - When eating red meat: choose lean cuts and keep the portion to the size of deck of cards - Eggs: approx. 0 to 4 times a week  - Fish and lean poultry: at least 2 a week  - Healthy protein sources include, chicken, turkey, lean beef, lamb - Increase intake of seafood such as tuna, salmon, trout, mackerel, shrimp, scallops - Avoid or limit high fat processed meats such as sausage and bacon  Dairy - Include moderate amounts of low fat dairy products  - Focus on healthy dairy such as fat free yogurt, skim milk, low or reduced fat cheese - Limit dairy products higher in fat such as whole or 2% milk, cheese,  ice cream  Alcohol - Moderate amounts of red wine is ok  - No more than 5 oz daily for women (all ages) and men older than age 65  - No more than 10 oz of wine daily for men younger than 65  Other - Limit sweets and other desserts  - Use herbs and spices instead of salt to flavor foods  - Herbs and spices common to the traditional Mediterranean Diet include: basil, bay leaves, chives, cloves, cumin, fennel, garlic, lavender, marjoram, mint, oregano, parsley, pepper, rosemary, sage, savory, sumac, tarragon, thyme   It's not just a diet, it's a lifestyle:  . The Mediterranean diet   includes lifestyle factors typical of those in the region  . Foods, drinks and meals are best eaten with others and savored . Daily physical activity is important for overall good health . This could be strenuous exercise like running and aerobics . This could also be more leisurely activities such as walking, housework, yard-work, or taking the stairs . Moderation is the key; a balanced and healthy diet accommodates most foods and drinks . Consider portion sizes and frequency of consumption of certain foods   Meal Ideas & Options:  . Breakfast:  o Whole wheat toast or whole wheat English muffins with peanut butter & hard boiled egg o Steel cut oats topped with apples & cinnamon and skim milk  o Fresh fruit: banana, strawberries, melon, berries, peaches  o Smoothies: strawberries, bananas, greek yogurt, peanut butter o Low fat greek yogurt with blueberries and granola  o Egg white omelet with spinach and mushrooms o Breakfast couscous: whole wheat couscous, apricots, skim milk, cranberries  . Sandwiches:  o Hummus and grilled vegetables (peppers, zucchini, squash) on whole wheat bread   o Grilled chicken on whole wheat pita with lettuce, tomatoes, cucumbers or tzatziki  o Tuna salad on whole wheat bread: tuna salad made with greek yogurt, olives, red peppers, capers, green onions o Garlic rosemary lamb pita:  lamb sauted with garlic, rosemary, salt & pepper; add lettuce, cucumber, greek yogurt to pita - flavor with lemon juice and black pepper  . Seafood:  o Mediterranean grilled salmon, seasoned with garlic, basil, parsley, lemon juice and black pepper o Shrimp, lemon, and spinach whole-grain pasta salad made with low fat greek yogurt  o Seared scallops with lemon orzo  o Seared tuna steaks seasoned salt, pepper, coriander topped with tomato mixture of olives, tomatoes, olive oil, minced garlic, parsley, green onions and cappers  . Meats:  o Herbed greek chicken salad with kalamata olives, cucumber, feta  o Red bell peppers stuffed with spinach, bulgur, lean ground beef (or lentils) & topped with feta   o Kebabs: skewers of chicken, tomatoes, onions, zucchini, squash  o Turkey burgers: made with red onions, mint, dill, lemon juice, feta cheese topped with roasted red peppers . Vegetarian o Cucumber salad: cucumbers, artichoke hearts, celery, red onion, feta cheese, tossed in olive oil & lemon juice  o Hummus and whole grain pita points with a greek salad (lettuce, tomato, feta, olives, cucumbers, red onion) o Lentil soup with celery, carrots made with vegetable broth, garlic, salt and pepper  o Tabouli salad: parsley, bulgur, mint, scallions, cucumbers, tomato, radishes, lemon juice, olive oil, salt and pepper.       Fat and Cholesterol Restricted Eating Plan Eating a diet that limits fat and cholesterol may help lower your risk for heart disease and other conditions. Your body needs fat and cholesterol for basic functions, but eating too much of these things can be harmful to your health. Your health care provider may order lab tests to check your blood fat (lipid) and cholesterol levels. This helps your health care provider understand your risk for certain conditions and whether you need to make diet changes. Work with your health care provider or dietitian to make an eating plan that is right for  you. Your plan includes:  Limit your fat intake to ______% or less of your total calories a day.  Limit your saturated fat intake to ______% or less of your total calories a day.  Limit the amount of cholesterol in your diet to less   than _________mg a day.  Eat ___________ g of fiber a day. What are tips for following this plan? General guidelines   If you are overweight, work with your health care provider to lose weight safely. Losing just 5-10% of your body weight can improve your overall health and help prevent diseases such as diabetes and heart disease.  Avoid: ? Foods with added sugar. ? Fried foods. ? Foods that contain partially hydrogenated oils, including stick margarine, some tub margarines, cookies, crackers, and other baked goods.  Limit alcohol intake to no more than 1 drink a day for nonpregnant women and 2 drinks a day for men. One drink equals 12 oz of beer, 5 oz of wine, or 1 oz of hard liquor. Reading food labels  Check food labels for: ? Trans fats, partially hydrogenated oils, or high amounts of saturated fat. Avoid foods that contain saturated fat and trans fat. ? The amount of cholesterol in each serving. Try to eat no more than 200 mg of cholesterol each day. ? The amount of fiber in each serving. Try to eat at least 20-30 g of fiber each day.  Choose foods with healthy fats, such as: ? Monounsaturated and polyunsaturated fats. These include olive and canola oil, flaxseeds, walnuts, almonds, and seeds. ? Omega-3 fats. These are found in foods such as salmon, mackerel, sardines, tuna, flaxseed oil, and ground flaxseeds.  Choose grain products that have whole grains. Look for the word "whole" as the first word in the ingredient list. Cooking  Cook foods using methods other than frying. Baking, boiling, grilling, and broiling are some healthy options.  Eat more home-cooked food and less restaurant, buffet, and fast food.  Avoid cooking using saturated  fats. ? Animal sources of saturated fats include meats, butter, and cream. ? Plant sources of saturated fats include palm oil, palm kernel oil, and coconut oil. Meal planning   At meals, imagine dividing your plate into fourths: ? Fill one-half of your plate with vegetables and green salads. ? Fill one-fourth of your plate with whole grains. ? Fill one-fourth of your plate with lean protein foods.  Eat fish that is high in omega-3 fats at least two times a week.  Eat more foods that contain fiber, such as whole grains, beans, apples, broccoli, carrots, peas, and barley. These foods help promote healthy cholesterol levels in the blood. Recommended foods Grains  Whole grains, such as whole wheat or whole grain breads, crackers, cereals, and pasta. Unsweetened oatmeal, bulgur, barley, quinoa, or brown rice. Corn or whole wheat flour tortillas. Vegetables  Fresh or frozen vegetables (raw, steamed, roasted, or grilled). Green salads. Fruits  All fresh, canned (in natural juice), or frozen fruits. Meats and other protein foods  Ground beef (85% or leaner), grass-fed beef, or beef trimmed of fat. Skinless chicken or turkey. Ground chicken or turkey. Pork trimmed of fat. All fish and seafood. Egg whites. Dried beans, peas, or lentils. Unsalted nuts or seeds. Unsalted canned beans. Natural nut butters without added sugar and oil. Dairy  Low-fat or nonfat dairy products, such as skim or 1% milk, 2% or reduced-fat cheeses, low-fat and fat-free ricotta or cottage cheese, or plain low-fat and nonfat yogurt. Fats and oils  Tub margarine without trans fats. Light or reduced-fat mayonnaise and salad dressings. Avocado. Olive, canola, sesame, or safflower oils. The items listed above may not be a complete list of recommended foods or beverages. Contact your dietitian for more options. Foods to avoid Grains  White bread.   White pasta. White rice. Cornbread. Bagels, pastries, and croissants.  Crackers and snack foods that contain trans fat and hydrogenated oils. Vegetables  Vegetables cooked in cheese, cream, or butter sauce. Fried vegetables. Fruits  Canned fruit in heavy syrup. Fruit in cream or butter sauce. Fried fruit. Meats and other protein foods  Fatty cuts of meat. Ribs, chicken wings, bacon, sausage, bologna, salami, chitterlings, fatback, hot dogs, bratwurst, and packaged lunch meats. Liver and organ meats. Whole eggs and egg yolks. Chicken and turkey with skin. Fried meat. Dairy  Whole or 2% milk, cream, half-and-half, and cream cheese. Whole milk cheeses. Whole-fat or sweetened yogurt. Full-fat cheeses. Nondairy creamers and whipped toppings. Processed cheese, cheese spreads, and cheese curds. Beverages  Alcohol. Sugar-sweetened drinks such as sodas, lemonade, and fruit drinks. Fats and oils  Butter, stick margarine, lard, shortening, ghee, or bacon fat. Coconut, palm kernel, and palm oils. Sweets and desserts  Corn syrup, sugars, honey, and molasses. Candy. Jam and jelly. Syrup. Sweetened cereals. Cookies, pies, cakes, donuts, muffins, and ice cream. The items listed above may not be a complete list of foods and beverages to avoid. Contact your dietitian for more information. Summary  Your body needs fat and cholesterol for basic functions. However, eating too much of these things can be harmful to your health.  Work with your health care provider and dietitian to follow a diet low in fat and cholesterol. Doing this may help lower your risk for heart disease and other conditions.  Choose healthy fats, such as monounsaturated and polyunsaturated fats, and foods high in omega-3 fatty acids.  Eat fiber-rich foods, such as whole grains, beans, peas, fruits, and vegetables.  Limit or avoid alcohol, fried foods, and foods high in saturated fats, partially hydrogenated oils, and sugar. This information is not intended to replace advice given to you by your health  care provider. Make sure you discuss any questions you have with your health care provider. Document Released: 06/11/2005 Document Revised: 05/24/2017 Document Reviewed: 02/26/2017 Elsevier Patient Education  2020 [employer] Inc.  American Heart Association (AHA) Exercise Recommendation  Being physically active is important to prevent heart disease and stroke, the nation's No. 1and No. 5killers. To improve overall cardiovascular health, we suggest at least 150 minutes per week of moderate exercise or 75 minutes per week of vigorous exercise (or a combination of moderate and vigorous activity). Thirty minutes a day, five times a week is an easy goal to remember. You will also experience benefits even if you divide your time into two or three segments of 10 to 15 minutes per day.  For people who would benefit from lowering their blood pressure or cholesterol, we recommend 40 minutes of aerobic exercise of moderate to vigorous intensity three to four times a week to lower the risk for heart attack and stroke.  Physical activity is anything that makes you move your body and burn calories.  This includes things like climbing stairs or playing sports. Aerobic exercises benefit your heart, and include walking, jogging, swimming or biking. Strength and stretching exercises are best for overall stamina and flexibility.  The simplest, positive change you can make to effectively improve your heart health is to start walking. It's enjoyable, free, easy, social and great exercise. A walking program is flexible and boasts high success rates because people can stick with it. It's easy for walking to become a regular and satisfying part of life.   For Overall Cardiovascular Health:  At least 30 minutes of moderate-intensity aerobic activity   at least 5 days per week for a total of 150  OR   At least 25 minutes of vigorous aerobic activity at least 3 days per week for a total of 75 minutes; or a combination of  moderate- and vigorous-intensity aerobic activity  AND   Moderate- to high-intensity muscle-strengthening activity at least 2 days per week for additional health benefits.  For Lowering Blood Pressure and Cholesterol  An average 40 minutes of moderate- to vigorous-intensity aerobic activity 3 or 4 times per week  What if I can't make it to the time goal? Something is always better than nothing! And everyone has to start somewhere. Even if you've been sedentary for years, today is the day you can begin to make healthy changes in your life. If you don't think you'll make it for 30 or 40 minutes, set a reachable goal for today. You can work up toward your overall goal by increasing your time as you get stronger. Don't let all-or-nothing thinking rob you of doing what you can every day.  Source:http://www.heart.org    

## 2019-05-13 NOTE — Progress Notes (Signed)
Established Patient Office Visit  Subjective:  Patient ID: Alexander Hunter, male    DOB: [birth year]  Age: 68 y.o. MRN: [id number]  CC:  Chief Complaint  Patient presents with  . Annual Exam    HPI [name] presents for CPE  Has ongoing pain on the lateral edge of his right foot. He has been to podiatry, originally to manage L ankle pain, and was told that there is a sizeable callus on his R foot that will continue to recur despite surgical removal. He is hoping to get a second opinion.  Otherwise, feels well overall. No urgent complaints. Follows with cardiology annually as he has had a history of STEMI, but no cardiac concerns at this time.  He states that he has lost weight recently - about 5-10 pounds over the course of the past 5-6 months. He had started a new job in that time that requires much more physical activity than he's used to, and his diet has not increased. We discussed that this is almost certainly the cause, and that amount of weight loss is not concerning at this time.  Past Medical History:  Diagnosis Date  . Back pain    states has been like this for yrs  . Coronary artery disease   . History of blood transfusion 11/2014   no abnormal reaction noted  . HLD (hyperlipidemia)    takes Atorvastatin daily  . Insomnia    doesn't take any meds  . Myocardial infarction ([place]) 11/2014  . Nocturia   . Pneumonia    as a child  . Shortness of breath dyspnea    rarely but notices with exertion.States doesn't happen during cardiac reheab    Past Surgical History:  Procedure Laterality Date  . CARDIAC CATHETERIZATION  12-16-14  . CORONARY ARTERY BYPASS GRAFT  11/2014   x 4-done at [facility]  . HERNIA REPAIR     as a child  . INGUINAL HERNIA REPAIR Bilateral 06/01/2015   Procedure: OPEN  BILATERAL HERNIA REPAIR;  Surgeon: Arta Bruce Kinsinger, MD;  Location: [place];  Service: General;  Laterality: Bilateral;  . INSERTION OF MESH Bilateral 06/01/2015   Procedure:  INSERTION OF MESH;  Surgeon: Mickeal Skinner, MD;  Location: [place];  Service: General;  Laterality: Bilateral;  . TONSILLECTOMY AND ADENOIDECTOMY     as a child    Family History  Problem Relation Age of Onset  . Heart attack Mother   . Heart disease Mother        Heart attack  . Heart attack Father   . Heart disease Father        Heart attack  . Heart attack Maternal Grandmother   . Heart disease Maternal Grandmother        Heart attack  . Heart attack Maternal Grandfather   . Heart disease Maternal Grandfather        Heart attack    Social History   Socioeconomic History  . Marital status: Divorced    Spouse name: Not on file  . Number of children: Not on file  . Years of education: Not on file  . Highest education level: Not on file  Occupational History  . Occupation: Designer, industrial/product  Social Needs  . Financial resource strain: Not on file  . Food insecurity    Worry: Not on file    Inability: Not on file  . Transportation needs    Medical: Not on file    Non-medical: Not on  file  Tobacco Use  . Smoking status: Former Games developer  . Smokeless tobacco: Never Used  . Tobacco comment: quit smoking 12/2014  Substance and Sexual Activity  . Alcohol use: Yes    Alcohol/week: 0.0 standard drinks    Comment: beer 6-12 pk/wk;3-4 mixed drinks a week  . Drug use: No  . Sexual activity: Not on file  Lifestyle  . Physical activity    Days per week: Not on file    Minutes per session: Not on file  . Stress: Not on file  Relationships  . Social Musician on phone: Not on file    Gets together: Not on file    Attends religious service: Not on file    Active member of club or organization: Not on file    Attends meetings of clubs or organizations: Not on file    Relationship status: Not on file  . Intimate partner violence    Fear of current or ex partner: Not on file    Emotionally abused: Not on file    Physically abused: Not on file    Forced  sexual activity: Not on file  Other Topics Concern  . Not on file  Social History Narrative  . Not on file    Outpatient Medications Prior to Visit  Medication Sig Dispense Refill  . aspirin 325 MG tablet Take 325 mg by mouth daily.    . Multiple Vitamin (THERA) TABS Take by mouth.    Marland Kitchen atorvastatin (LIPITOR) 80 MG tablet TK 1 T PO QD    . lisinopril (PRINIVIL,ZESTRIL) 5 MG tablet Take by mouth.    . diclofenac (VOLTAREN) 75 MG EC tablet Take 1 tablet (75 mg total) by mouth 2 (two) times daily. (Patient not taking: Reported on 05/13/2019) 60 tablet 0   No facility-administered medications prior to visit.     No Known Allergies  ROS Review of Systems  Constitutional: Negative.   HENT: Negative.   Eyes: Negative.   Respiratory: Negative.   Cardiovascular: Negative.   Gastrointestinal: Negative.   Endocrine: Negative.   Genitourinary: Negative.   Musculoskeletal: Negative.   Skin: Negative.   Allergic/Immunologic: Negative.   Neurological: Negative.   Hematological: Negative.   Psychiatric/Behavioral: Negative.   All other systems reviewed and are negative.     Objective:    Physical Exam  Constitutional: He is oriented to person, place, and time. He appears well-developed and well-nourished. No distress.  HENT:  Head: Normocephalic and atraumatic.  Right Ear: External ear normal.  Left Ear: External ear normal.  Nose: Nose normal.  Mouth/Throat: Oropharynx is clear and moist. No oropharyngeal exudate.  Eyes: Pupils are equal, round, and reactive to light. Conjunctivae and EOM are normal. Right eye exhibits no discharge. Left eye exhibits no discharge. No scleral icterus.  Neck: Normal range of motion. Neck supple. No tracheal deviation present. No thyromegaly present.  Cardiovascular: Normal rate, regular rhythm, normal heart sounds and intact distal pulses. Exam reveals no gallop and no friction rub.  No murmur heard. Pulmonary/Chest: Effort normal and breath  sounds normal. No respiratory distress. He has no wheezes. He has no rales. He exhibits no tenderness.  Abdominal: Soft. Bowel sounds are normal. He exhibits no distension and no mass. There is no abdominal tenderness. There is no rebound and no guarding.  Musculoskeletal: Normal range of motion.        General: No tenderness, deformity or edema.  Lymphadenopathy:    He has no cervical  adenopathy.  Neurological: He is alert and oriented to person, place, and time. He has normal reflexes. No cranial nerve deficit. He exhibits normal muscle tone. Coordination normal.  Skin: Skin is warm and dry. No rash noted. He is not diaphoretic. No erythema. No pallor.     Psychiatric: He has a normal mood and affect. His behavior is normal. Judgment and thought content normal.  Nursing note and vitals reviewed.   BP 118/70   Pulse 87   Temp 98 F (36.7 C) (Oral)   Resp 16   Ht 5' 7.32" (1.71 m)   Wt 145 lb (65.8 kg)   SpO2 97%   BMI 22.49 kg/m  Wt Readings from Last 3 Encounters:  05/13/19 145 lb (65.8 kg)  10/30/17 167 lb 6.4 oz (75.9 kg)  08/22/16 173 lb (78.5 kg)     There are no preventive care reminders to display for this patient.  There are no preventive care reminders to display for this patient.  Lab Results  Component Value Date   TSH 1.410 08/22/2016   Lab Results  Component Value Date   WBC 5.8 08/22/2016   HGB 14.5 08/22/2016   HCT 44.7 08/22/2016   MCV 89 08/22/2016   PLT 170 08/22/2016   Lab Results  Component Value Date   NA 141 08/22/2016   K 4.5 08/22/2016   CO2 25 08/22/2016   GLUCOSE 88 08/22/2016   BUN 22 08/22/2016   CREATININE 0.97 08/22/2016   BILITOT 0.5 08/22/2016   ALKPHOS 55 08/22/2016   AST 22 08/22/2016   ALT 24 08/22/2016   PROT 7.4 08/22/2016   ALBUMIN 4.3 08/22/2016   CALCIUM 9.7 08/22/2016   ANIONGAP 7 05/26/2015   Lab Results  Component Value Date   CHOL 252 (H) 08/22/2016   Lab Results  Component Value Date   HDL 74  08/22/2016   Lab Results  Component Value Date   LDLCALC 163 (H) 08/22/2016   Lab Results  Component Value Date   TRIG 73 08/22/2016   Lab Results  Component Value Date   CHOLHDL 3.4 08/22/2016   No results found for: HGBA1C    Assessment & Plan:   Problem List Items Addressed This Visit      Other   Hypercholesteremia   Relevant Medications   atorvastatin (LIPITOR) 80 MG tablet    Other Visit Diagnoses    Annual physical exam    -  Primary   Screening for endocrine, metabolic and immunity disorder       Relevant Orders   CBC with Differential   Comprehensive metabolic panel   Hemoglobin A1c   TSH   Screening PSA (prostate specific antigen)       Relevant Orders   PSA   Lipid screening       Relevant Orders   Lipid panel   Need for pneumococcal vaccination       Relevant Orders   Prevnar 13 (Completed)   Pain in joint involving right ankle and foot       Relevant Medications   diclofenac Sodium (VOLTAREN) 1 % GEL   Other Relevant Orders   Ambulatory referral to Podiatry   Routine general medical examination at a health care facility          Meds ordered this encounter  Medications  . diclofenac Sodium (VOLTAREN) 1 % GEL    Sig: Apply 2 g topically 2 (two) times daily.    Dispense:  50 g  Refill:  1    Order Specific Question:   Supervising Provider    Answer:   Collie SiadSTALLINGS, ZOE A [id number]]  . atorvastatin (LIPITOR) 80 MG tablet    Sig: Take 1 tablet by mouth each evening.    Dispense:  90 tablet    Refill:  3    Order Specific Question:   Supervising Provider    Answer:   Doristine BosworthSTALLINGS, ZOE A [id number]]    Follow-up: No follow-ups on file.    Janeece Ageeichard Caysie Minnifield, NP

## 2019-05-14 LAB — CBC WITH DIFFERENTIAL/PLATELET
Basophils Absolute: 0.1 10*3/uL (ref 0.0–0.2)
Basos: 1 %
EOS (ABSOLUTE): 0.2 10*3/uL (ref 0.0–0.4)
Eos: 4 %
Hematocrit: 43.2 % (ref 37.5–51.0)
Hemoglobin: 15 g/dL (ref 13.0–17.7)
Immature Grans (Abs): 0 10*3/uL (ref 0.0–0.1)
Immature Granulocytes: 0 %
Lymphocytes Absolute: 1.6 10*3/uL (ref 0.7–3.1)
Lymphs: 33 %
MCH: 29.3 pg (ref 26.6–33.0)
MCHC: 34.7 g/dL (ref 31.5–35.7)
MCV: 84 fL (ref 79–97)
Monocytes Absolute: 0.5 10*3/uL (ref 0.1–0.9)
Monocytes: 10 %
Neutrophils Absolute: 2.4 10*3/uL (ref 1.4–7.0)
Neutrophils: 52 %
Platelets: 166 10*3/uL (ref 150–450)
RBC: 5.12 x10E6/uL (ref 4.14–5.80)
RDW: 13.5 % (ref 11.6–15.4)
WBC: 4.7 10*3/uL (ref 3.4–10.8)

## 2019-05-14 LAB — COMPREHENSIVE METABOLIC PANEL
ALT: 25 IU/L (ref 0–44)
AST: 24 IU/L (ref 0–40)
Albumin/Globulin Ratio: 1.4 (ref 1.2–2.2)
Albumin: 4.4 g/dL (ref 3.8–4.8)
Alkaline Phosphatase: 65 IU/L (ref 39–117)
BUN/Creatinine Ratio: 19 (ref 10–24)
BUN: 20 mg/dL (ref 8–27)
Bilirubin Total: 0.5 mg/dL (ref 0.0–1.2)
CO2: 26 mmol/L (ref 20–29)
Calcium: 9.7 mg/dL (ref 8.6–10.2)
Chloride: 100 mmol/L (ref 96–106)
Creatinine, Ser: 1.05 mg/dL (ref 0.76–1.27)
GFR calc Af Amer: 85 mL/min/{1.73_m2} (ref >59)
GFR calc non Af Amer: 74 mL/min/{1.73_m2} (ref >59)
Globulin, Total: 3.2 g/dL (ref 1.5–4.5)
Glucose: 96 mg/dL (ref 65–99)
Potassium: 4.6 mmol/L (ref 3.5–5.2)
Sodium: 138 mmol/L (ref 134–144)
Total Protein: 7.6 g/dL (ref 6.0–8.5)

## 2019-05-14 LAB — TSH: TSH: 1.11 u[IU]/mL (ref 0.450–4.500)

## 2019-05-14 LAB — HEMOGLOBIN A1C
Est. average glucose Bld gHb Est-mCnc: 111 mg/dL
Hgb A1c MFr Bld: 5.5 % (ref 4.8–5.6)

## 2019-05-14 LAB — LIPID PANEL
Chol/HDL Ratio: 2.1 ratio (ref 0.0–5.0)
Cholesterol, Total: 264 mg/dL — ABNORMAL HIGH (ref 100–199)
HDL: 127 mg/dL (ref >39)
LDL Chol Calc (NIH): 129 mg/dL — ABNORMAL HIGH (ref 0–99)
Triglycerides: 54 mg/dL (ref 0–149)
VLDL Cholesterol Cal: 8 mg/dL (ref 5–40)

## 2019-05-14 LAB — PSA: Prostate Specific Ag, Serum: 1.6 ng/mL (ref 0.0–4.0)

## 2019-05-18 NOTE — Progress Notes (Signed)
Hi Dr. Nolon Rod - I recently saw your patient, Alexander Hunter, for his physical and labs. Unfortunately, his lipids are still elevated despite his 80mg  atorvastatin daily. I wanted to make you aware in case there were any changes you wanted to make. Thank you,  Kathrin Ruddy, NP

## 2020-01-13 ENCOUNTER — Other Ambulatory Visit: Payer: Self-pay | Admitting: Podiatry

## 2020-01-13 ENCOUNTER — Ambulatory Visit (INDEPENDENT_AMBULATORY_CARE_PROVIDER_SITE_OTHER): Payer: 59 | Admitting: Podiatry

## 2020-01-13 ENCOUNTER — Other Ambulatory Visit: Payer: Self-pay

## 2020-01-13 ENCOUNTER — Ambulatory Visit (INDEPENDENT_AMBULATORY_CARE_PROVIDER_SITE_OTHER): Payer: 59

## 2020-01-13 ENCOUNTER — Encounter: Payer: Self-pay | Admitting: Podiatry

## 2020-01-13 VITALS — Temp 97.5°F

## 2020-01-13 DIAGNOSIS — M778 Other enthesopathies, not elsewhere classified: Secondary | ICD-10-CM

## 2020-01-13 DIAGNOSIS — M779 Enthesopathy, unspecified: Secondary | ICD-10-CM

## 2020-01-13 DIAGNOSIS — M25571 Pain in right ankle and joints of right foot: Secondary | ICD-10-CM

## 2020-01-13 MED ORDER — DICLOFENAC SODIUM 75 MG PO TBEC: 75.0000 mg | DELAYED_RELEASE_TABLET | Freq: Two times a day (BID) | ORAL | 2 refills | Status: DC

## 2020-01-13 NOTE — Progress Notes (Signed)
Subjective:   Patient ID: Alexander Hunter, male   DOB: 69 y.o.   MRN: [id number]   HPI Patient presents stating I have had some swelling in my left ankle and its been bothering me the brace seems to help I take oral anti-inflammatories which help me when needed and also it seems to get quite inflamed    ROS      Objective:  Physical Exam  Neurovascular status was found to be intact with diminished range of motion subtalar joint left with probability for arthritis within the joint.  It is inflamed and it does get sore and also seems to be worse with motion.  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  I want to try to keep this under control as far as swelling goes and I did do sterile prep and I injected the sinus tarsi left 3 mg Kenalog 5 mg Xylocaine and I did reapply fascial brace with instructions at this time.  May require other treatments or more advanced studies if symptoms persist

## 2020-01-15 ENCOUNTER — Telehealth: Payer: Self-pay | Admitting: Podiatry

## 2020-01-15 NOTE — Telephone Encounter (Signed)
Pt was seen here 01-13-20 & states pain medicine was not sent to pharmacy.  [facility]

## 2020-01-18 ENCOUNTER — Other Ambulatory Visit: Payer: Self-pay

## 2020-01-18 ENCOUNTER — Telehealth: Payer: Self-pay | Admitting: Podiatry

## 2020-01-18 ENCOUNTER — Ambulatory Visit: Payer: 59 | Admitting: Orthotics

## 2020-01-18 DIAGNOSIS — M779 Enthesopathy, unspecified: Secondary | ICD-10-CM

## 2020-01-18 DIAGNOSIS — M79671 Pain in right foot: Secondary | ICD-10-CM

## 2020-01-18 DIAGNOSIS — M25571 Pain in right ankle and joints of right foot: Secondary | ICD-10-CM

## 2020-01-18 DIAGNOSIS — M2141 Flat foot [pes planus] (acquired), right foot: Secondary | ICD-10-CM

## 2020-01-18 DIAGNOSIS — M2142 Flat foot [pes planus] (acquired), left foot: Secondary | ICD-10-CM

## 2020-01-18 MED ORDER — DICLOFENAC SODIUM 75 MG PO TBEC: 75.0000 mg | DELAYED_RELEASE_TABLET | Freq: Two times a day (BID) | ORAL | 0 refills | Status: DC

## 2020-01-18 NOTE — Progress Notes (Signed)
Refurbish f/o he got in 2019.

## 2020-02-01 ENCOUNTER — Encounter: Payer: 59 | Admitting: Orthotics

## 2020-06-07 ENCOUNTER — Telehealth: Payer: Self-pay | Admitting: Registered Nurse

## 2020-06-07 NOTE — Telephone Encounter (Signed)
Left message on patient's voicemail to change appointment type from [facility] to CPE for DOS 08/02/20. Also stated in message that appointment for 08/05/20 was canceled. Requested call back from patient to confirm his receipt of my message.

## 2020-06-07 NOTE — Telephone Encounter (Signed)
Patient called back to confirm receipt of my message.

## 2020-08-02 ENCOUNTER — Ambulatory Visit (INDEPENDENT_AMBULATORY_CARE_PROVIDER_SITE_OTHER): Payer: 59 | Admitting: Registered Nurse

## 2020-08-02 ENCOUNTER — Other Ambulatory Visit: Payer: Self-pay

## 2020-08-02 ENCOUNTER — Encounter: Payer: Self-pay | Admitting: Registered Nurse

## 2020-08-02 VITALS — BP 126/78 | HR 85 | Temp 98.0°F | Resp 18 | Ht 67.0 in | Wt 153.0 lb

## 2020-08-02 DIAGNOSIS — Z13228 Encounter for screening for other metabolic disorders: Secondary | ICD-10-CM

## 2020-08-02 DIAGNOSIS — S93401A Sprain of unspecified ligament of right ankle, initial encounter: Secondary | ICD-10-CM

## 2020-08-02 DIAGNOSIS — N529 Male erectile dysfunction, unspecified: Secondary | ICD-10-CM | POA: Diagnosis not present

## 2020-08-02 DIAGNOSIS — S96911A Strain of unspecified muscle and tendon at ankle and foot level, right foot, initial encounter: Secondary | ICD-10-CM

## 2020-08-02 DIAGNOSIS — Z130 Encounter for screening for diseases of the blood and blood-forming organs and certain disorders involving the immune mechanism: Secondary | ICD-10-CM

## 2020-08-02 DIAGNOSIS — Z0001 Encounter for general adult medical examination with abnormal findings: Secondary | ICD-10-CM | POA: Diagnosis not present

## 2020-08-02 DIAGNOSIS — Z125 Encounter for screening for malignant neoplasm of prostate: Secondary | ICD-10-CM

## 2020-08-02 DIAGNOSIS — G8929 Other chronic pain: Secondary | ICD-10-CM | POA: Diagnosis not present

## 2020-08-02 DIAGNOSIS — Z1329 Encounter for screening for other suspected endocrine disorder: Secondary | ICD-10-CM | POA: Diagnosis not present

## 2020-08-02 DIAGNOSIS — Z13220 Encounter for screening for lipoid disorders: Secondary | ICD-10-CM | POA: Diagnosis not present

## 2020-08-02 DIAGNOSIS — M545 Low back pain, unspecified: Secondary | ICD-10-CM | POA: Diagnosis not present

## 2020-08-02 MED ORDER — METHOCARBAMOL 500 MG PO TABS
500.0000 mg | ORAL_TABLET | Freq: Two times a day (BID) | ORAL | 0 refills | Status: DC | PRN
Start: 2020-08-02 — End: 2021-04-03

## 2020-08-02 MED ORDER — PREDNISONE 20 MG PO TABS: 20.0000 mg | ORAL_TABLET | Freq: Every day | ORAL | 0 refills | Status: DC

## 2020-08-02 NOTE — Patient Instructions (Signed)
° ° ° °  If you have lab work done today you will be contacted with your lab results within the next 2 weeks.  If you have not heard from us then please contact us. The fastest way to get your results is to register for My Chart. ° ° °IF you received an x-ray today, you will receive an invoice from [facility]. Please contact [facility] at [phone] with questions or concerns regarding your invoice.  ° °IF you received labwork today, you will receive an invoice from [facility]. Please contact [facility] at [phone] with questions or concerns regarding your invoice.  ° °Our billing staff will not be able to assist you with questions regarding bills from these companies. ° °You will be contacted with the lab results as soon as they are available. The fastest way to get your results is to activate your My Chart account. Instructions are located on the last page of this paperwork. If you have not heard from us regarding the results in 2 weeks, please contact this office. °  ° ° ° °

## 2020-08-02 NOTE — Progress Notes (Signed)
Established Patient Office Visit  Subjective:  Patient ID: Alexander Hunter, male    DOB: March 02, 1951  Age: 70 y.o. MRN: [id number]  CC:  Chief Complaint  Patient presents with  . Annual Exam    Patient states he is here for and CPE and some foot pain.    HPI Alexander Hunter presents for CPE. Histories reviewed, update as warranted Pt has brought in labs that were completed in May 2021. Reviewed. No urgent concerns. Pt has a number of other concerns today:  Back pain: Lower back, bilateral, but R>L.  Works bent forward at work for hours at a time Feels worse after work Feels better with rest No urinary or GI symptoms. No bony pain or tenderness.  Ankle pain: Unfortunately tripped at work and twisted his ankle Acute pain, some swelling anterior/lateral  No bony tenderness Improved over past 5 days since this occurred. Has sprained ankle in past, feels similar Can bear weight without issue  Otherwise no concerns, feeling well overall.   Past Medical History:  Diagnosis Date  . Back pain    states has been like this for yrs  . Coronary artery disease   . History of blood transfusion 11/2014   no abnormal reaction noted  . HLD (hyperlipidemia)    takes Atorvastatin daily  . Insomnia    doesn't take any meds  . Myocardial infarction ([facility]) 11/2014  . Nocturia   . Pneumonia    as a child  . Shortness of breath dyspnea    rarely but notices with exertion.States doesn't happen during cardiac reheab    Past Surgical History:  Procedure Laterality Date  . CARDIAC CATHETERIZATION  12-16-14  . CORONARY ARTERY BYPASS GRAFT  11/2014   x 4-done at [facility]  . HERNIA REPAIR     as a child  . INGUINAL HERNIA REPAIR Bilateral 06/01/2015   Procedure: OPEN  BILATERAL HERNIA REPAIR;  Surgeon: De Blanch Kinsinger, MD;  [facility];  Service: General;  Laterality: Bilateral;  . INSERTION OF MESH Bilateral 06/01/2015   Procedure: INSERTION OF MESH;  Surgeon: Rodman Pickle,  MD;  Location: [facility] OR;  Service: General;  Laterality: Bilateral;  . TONSILLECTOMY AND ADENOIDECTOMY     as a child    Family History  Problem Relation Age of Onset  . Heart attack Mother   . Heart disease Mother        Heart attack  . Heart attack Father   . Heart disease Father        Heart attack  . Heart attack Maternal Grandmother   . Heart disease Maternal Grandmother        Heart attack  . Heart attack Maternal Grandfather   . Heart disease Maternal Grandfather        Heart attack    Social History   Socioeconomic History  . Marital status: Divorced    Spouse name: Not on file  . Number of children: Not on file  . Years of education: Not on file  . Highest education level: Not on file  Occupational History  . Occupation: Hotel manager  Tobacco Use  . Smoking status: Former Games developer  . Smokeless tobacco: Never Used  . Tobacco comment: quit smoking 12/2014  Substance and Sexual Activity  . Alcohol use: Yes    Alcohol/week: 0.0 standard drinks    Comment: beer 6-12 pk/wk;3-4 mixed drinks a week  . Drug use: No  . Sexual activity: Not on file  Other  Topics Concern  . Not on file  Social History Narrative  . Not on file   Social Determinants of Health   Financial Resource Strain: Not on file  Food Insecurity: Not on file  Transportation Needs: Not on file  Physical Activity: Not on file  Stress: Not on file  Social Connections: Not on file  Intimate Partner Violence: Not on file    Outpatient Medications Prior to Visit  Medication Sig Dispense Refill  . aspirin 325 MG tablet Take 325 mg by mouth daily.    Marland Kitchen atorvastatin (LIPITOR) 80 MG tablet Take 1 tablet by mouth each evening. 90 tablet 3  . diclofenac (VOLTAREN) 75 MG EC tablet Take 1 tablet (75 mg total) by mouth 2 (two) times daily. (Patient not taking: Reported on 08/02/2020) 50 tablet 2  . diclofenac (VOLTAREN) 75 MG EC tablet Take 1 tablet (75 mg total) by mouth 2 (two) times daily. (Patient  not taking: Reported on 08/02/2020) 30 tablet 0  . diclofenac Sodium (VOLTAREN) 1 % GEL Apply 2 g topically 2 (two) times daily. (Patient not taking: Reported on 08/02/2020) 50 g 1  . Multiple Vitamin (THERA) TABS Take by mouth. (Patient not taking: Reported on 08/02/2020)     No facility-administered medications prior to visit.    No Known Allergies  ROS Review of Systems Per hpi     Objective:    Physical Exam Vitals and nursing note reviewed.  Constitutional:      General: He is not in acute distress.    Appearance: Normal appearance. He is normal weight. He is not ill-appearing, toxic-appearing or diaphoretic.  HENT:     Head: Normocephalic and atraumatic.     Right Ear: Tympanic membrane, ear canal and external ear normal. There is no impacted cerumen.     Left Ear: Tympanic membrane, ear canal and external ear normal. There is no impacted cerumen.     Nose: Nose normal. No congestion or rhinorrhea.     Mouth/Throat:     Mouth: Mucous membranes are moist.     Pharynx: Oropharynx is clear. No oropharyngeal exudate or posterior oropharyngeal erythema.  Eyes:     General: No scleral icterus.       Right eye: No discharge.        Left eye: No discharge.     Extraocular Movements: Extraocular movements intact.     Conjunctiva/sclera: Conjunctivae normal.     Pupils: Pupils are equal, round, and reactive to light.  Neck:     Vascular: No carotid bruit.  Cardiovascular:     Rate and Rhythm: Normal rate and regular rhythm.     Pulses: Normal pulses.     Heart sounds: Normal heart sounds. No murmur heard. No friction rub. No gallop.   Pulmonary:     Effort: Pulmonary effort is normal. No respiratory distress.     Breath sounds: Normal breath sounds. No stridor. No wheezing, rhonchi or rales.  Chest:     Chest wall: No tenderness.  Abdominal:     General: Abdomen is flat. Bowel sounds are normal. There is no distension.     Palpations: Abdomen is soft. There is no mass.      Tenderness: There is no abdominal tenderness. There is no right CVA tenderness, left CVA tenderness, guarding or rebound.     Hernia: No hernia is present.  Musculoskeletal:        General: No swelling, tenderness, deformity or signs of injury. Normal range of motion.  Cervical back: Normal range of motion and neck supple. No rigidity or tenderness.     Right lower leg: No edema.     Left lower leg: No edema.  Lymphadenopathy:     Cervical: No cervical adenopathy.  Skin:    General: Skin is warm and dry.     Capillary Refill: Capillary refill takes less than 2 seconds.     Coloration: Skin is not jaundiced or pale.     Findings: No bruising, erythema, lesion or rash.  Neurological:     General: No focal deficit present.     Mental Status: He is alert and oriented to person, place, and time. Mental status is at baseline.     Cranial Nerves: No cranial nerve deficit.     Motor: No weakness.     Gait: Gait normal.  Psychiatric:        Mood and Affect: Mood normal.        Behavior: Behavior normal.        Thought Content: Thought content normal.        Judgment: Judgment normal.     BP 126/78   Pulse 85   Temp 98 F (36.7 C) (Temporal)   Resp 18   Ht 5\' 7"  (1.702 m)   Wt 153 lb (69.4 kg)   SpO2 99%   BMI 23.96 kg/m  Wt Readings from Last 3 Encounters:  08/02/20 153 lb (69.4 kg)  05/13/19 145 lb (65.8 kg)  10/30/17 167 lb 6.4 oz (75.9 kg)     There are no preventive care reminders to display for this patient.  There are no preventive care reminders to display for this patient.  Lab Results  Component Value Date   TSH 1.110 05/13/2019   Lab Results  Component Value Date   WBC 4.7 05/13/2019   HGB 15.0 05/13/2019   HCT 43.2 05/13/2019   MCV 84 05/13/2019   PLT 166 05/13/2019   Lab Results  Component Value Date   NA 138 05/13/2019   K 4.6 05/13/2019   CO2 26 05/13/2019   GLUCOSE 96 05/13/2019   BUN 20 05/13/2019   CREATININE 1.05 05/13/2019   BILITOT  0.5 05/13/2019   ALKPHOS 65 05/13/2019   AST 24 05/13/2019   ALT 25 05/13/2019   PROT 7.6 05/13/2019   ALBUMIN 4.4 05/13/2019   CALCIUM 9.7 05/13/2019   ANIONGAP 7 05/26/2015   Lab Results  Component Value Date   CHOL 264 (H) 05/13/2019   Lab Results  Component Value Date   HDL 127 05/13/2019   Lab Results  Component Value Date   LDLCALC 129 (H) 05/13/2019   Lab Results  Component Value Date   TRIG 54 05/13/2019   Lab Results  Component Value Date   CHOLHDL 2.1 05/13/2019   Lab Results  Component Value Date   HGBA1C 5.5 05/13/2019      Assessment & Plan:   Problem List Items Addressed This Visit   None   Visit Diagnoses    Screening for endocrine, metabolic and immunity disorder    -  Primary   Relevant Orders   CBC With Differential   Comprehensive metabolic panel   Hemoglobin A1c   Vitamin D, 25-hydroxy   Lipid screening       Relevant Orders   Lipid panel   Vitamin D, 25-hydroxy   Screening PSA (prostate specific antigen)       Relevant Orders   PSA   Vitamin D, 25-hydroxy  Chronic right-sided low back pain without sciatica       Relevant Medications   predniSONE (DELTASONE) 20 MG tablet   methocarbamol (ROBAXIN) 500 MG tablet   Other Relevant Orders   Vitamin D, 25-hydroxy   Ambulatory referral to Physical Therapy   Sprain and strain of right ankle       Relevant Medications   predniSONE (DELTASONE) 20 MG tablet   Other Relevant Orders   Testosterone   Vitamin D, 25-hydroxy   Erectile dysfunction, unspecified erectile dysfunction type       Relevant Orders   Testosterone   Vitamin D, 25-hydroxy      Meds ordered this encounter  Medications  . predniSONE (DELTASONE) 20 MG tablet    Sig: Take 1 tablet (20 mg total) by mouth daily with breakfast.    Dispense:  4 tablet    Refill:  0    Order Specific Question:   Supervising Provider    Answer:   Neva Seat, JEFFREY R [[id number]]  . methocarbamol (ROBAXIN) 500 MG tablet    Sig: Take 1 tablet  (500 mg total) by mouth 2 (two) times daily as needed for muscle spasms.    Dispense:  20 tablet    Refill:  0    Order Specific Question:   Supervising Provider    Answer:   Neva Seat, JEFFREY R [[id number]]    Follow-up: No follow-ups on file.   PLAN  Lower back pain likely overuse from work  Ankle pain appears to be a mild sprain  Prednisone and robaxin for pain  Will refer to PT  Labs collected. Will follow up with the patient as warranted.  Exam unremarkable  Patient encouraged to call clinic with any questions, comments, or concerns.  Janeece Agee, NP

## 2020-08-03 ENCOUNTER — Telehealth: Payer: Self-pay | Admitting: Family Medicine

## 2020-08-03 LAB — CBC WITH DIFFERENTIAL
Basophils Absolute: 0.1 10*3/uL (ref 0.0–0.2)
Basos: 1 %
EOS (ABSOLUTE): 0.2 10*3/uL (ref 0.0–0.4)
Eos: 4 %
Hematocrit: 41.8 % (ref 37.5–51.0)
Hemoglobin: 13.7 g/dL (ref 13.0–17.7)
Immature Grans (Abs): 0 10*3/uL (ref 0.0–0.1)
Immature Granulocytes: 0 %
Lymphocytes Absolute: 1.4 10*3/uL (ref 0.7–3.1)
Lymphs: 29 %
MCH: 28.4 pg (ref 26.6–33.0)
MCHC: 32.8 g/dL (ref 31.5–35.7)
MCV: 87 fL (ref 79–97)
Monocytes Absolute: 0.4 10*3/uL (ref 0.1–0.9)
Monocytes: 9 %
Neutrophils Absolute: 2.8 10*3/uL (ref 1.4–7.0)
Neutrophils: 57 %
RBC: 4.83 x10E6/uL (ref 4.14–5.80)
RDW: 14.6 % (ref 11.6–15.4)
WBC: 4.9 10*3/uL (ref 3.4–10.8)

## 2020-08-03 LAB — COMPREHENSIVE METABOLIC PANEL
ALT: 28 IU/L (ref 0–44)
AST: 30 IU/L (ref 0–40)
Albumin/Globulin Ratio: 1.4 (ref 1.2–2.2)
Albumin: 4.2 g/dL (ref 3.8–4.8)
Alkaline Phosphatase: 62 IU/L (ref 44–121)
BUN/Creatinine Ratio: 22 (ref 10–24)
BUN: 20 mg/dL (ref 8–27)
Bilirubin Total: 0.5 mg/dL (ref 0.0–1.2)
CO2: 22 mmol/L (ref 20–29)
Calcium: 9.7 mg/dL (ref 8.6–10.2)
Chloride: 103 mmol/L (ref 96–106)
Creatinine, Ser: 0.93 mg/dL (ref 0.76–1.27)
GFR calc Af Amer: 98 mL/min/{1.73_m2} (ref >59)
GFR calc non Af Amer: 85 mL/min/{1.73_m2} (ref >59)
Globulin, Total: 2.9 g/dL (ref 1.5–4.5)
Glucose: 97 mg/dL (ref 65–99)
Potassium: 4.4 mmol/L (ref 3.5–5.2)
Sodium: 138 mmol/L (ref 134–144)
Total Protein: 7.1 g/dL (ref 6.0–8.5)

## 2020-08-03 LAB — LIPID PANEL
Chol/HDL Ratio: 2 ratio (ref 0.0–5.0)
Cholesterol, Total: 209 mg/dL — ABNORMAL HIGH (ref 100–199)
HDL: 102 mg/dL (ref >39)
LDL Chol Calc (NIH): 99 mg/dL (ref 0–99)
Triglycerides: 40 mg/dL (ref 0–149)
VLDL Cholesterol Cal: 8 mg/dL (ref 5–40)

## 2020-08-03 LAB — VITAMIN D 25 HYDROXY (VIT D DEFICIENCY, FRACTURES): Vit D, 25-Hydroxy: 23.9 ng/mL — ABNORMAL LOW (ref 30.0–100.0)

## 2020-08-03 LAB — HEMOGLOBIN A1C
Est. average glucose Bld gHb Est-mCnc: 126 mg/dL
Hgb A1c MFr Bld: 6 % — ABNORMAL HIGH (ref 4.8–5.6)

## 2020-08-03 LAB — PSA: Prostate Specific Ag, Serum: 2.1 ng/mL (ref 0.0–4.0)

## 2020-08-03 LAB — TESTOSTERONE: Testosterone: 553 ng/dL (ref 264–916)

## 2020-08-03 NOTE — Telephone Encounter (Signed)
Pt states he got the pfizer vaccine and the booster in December all at [facility].

## 2020-08-05 ENCOUNTER — Encounter: Payer: 59 | Admitting: Registered Nurse

## 2020-08-08 ENCOUNTER — Encounter: Payer: Self-pay | Admitting: Registered Nurse

## 2020-08-15 ENCOUNTER — Other Ambulatory Visit: Payer: Self-pay

## 2020-08-15 ENCOUNTER — Encounter: Payer: Self-pay | Admitting: Physical Therapy

## 2020-08-15 ENCOUNTER — Ambulatory Visit: Payer: 59 | Attending: Registered Nurse | Admitting: Physical Therapy

## 2020-08-15 DIAGNOSIS — M62830 Muscle spasm of back: Secondary | ICD-10-CM

## 2020-08-15 DIAGNOSIS — M545 Low back pain, unspecified: Secondary | ICD-10-CM

## 2020-08-15 NOTE — Therapy (Signed)
[facility] [address] [place], [place], [address] Phone: [phone]   Fax:  [phone]  Physical Therapy Evaluation  Patient Details  Name: Alexander Hunter MRN: [id number] Date of Birth: 1950-09-20 Referring Provider (PT): Janeece Agee, NP   Encounter Date: 08/15/2020   PT End of Session - 08/15/20 0940    Visit Number 1    Date for PT Re-Evaluation 10/13/20    Authorization Type UHC    PT Start Time 0915    PT Stop Time 0955    PT Time Calculation (min) 40 min    Activity Tolerance Patient tolerated treatment well    Behavior During Therapy [facility] for tasks assessed/performed           Past Medical History:  Diagnosis Date  . Back pain    states has been like this for yrs  . Coronary artery disease   . History of blood transfusion 11/2014   no abnormal reaction noted  . HLD (hyperlipidemia)    takes Atorvastatin daily  . Insomnia    doesn't take any meds  . Myocardial infarction (HCC) 11/2014  . Nocturia   . Pneumonia    as a child  . Shortness of breath dyspnea    rarely but notices with exertion.States doesn't happen during cardiac reheab    Past Surgical History:  Procedure Laterality Date  . CARDIAC CATHETERIZATION  12-16-14  . CORONARY ARTERY BYPASS GRAFT  11/2014   x 4-done at [facility]  . HERNIA REPAIR     as a child  . INGUINAL HERNIA REPAIR Bilateral 06/01/2015   Procedure: OPEN  BILATERAL HERNIA REPAIR;  Surgeon: De Blanch Kinsinger, MD;  Location: [facility] OR;  Service: General;  Laterality: Bilateral;  . INSERTION OF MESH Bilateral 06/01/2015   Procedure: INSERTION OF MESH;  Surgeon: Rodman Pickle, MD;  Location: [facility] OR;  Service: General;  Laterality: Bilateral;  . TONSILLECTOMY AND ADENOIDECTOMY     as a child    There were no vitals filed for this visit.    Subjective Assessment - 08/15/20 0922    Subjective Patient reports that he has had LBP since 1994, denies radicular signs.  He  reports no imaging has been done, I found x-rays from 2002 that showed degenerative changes.    Limitations Lifting;Standing;Walking;House hold activities    Patient Stated Goals have less pain, move easier    Currently in Pain? Yes    Pain Score 0-No pain    Pain Location Back    Pain Orientation Lower    Pain Descriptors / Indicators Aching    Pain Type Chronic pain    Pain Radiating Towards denies    Pain Onset More than a month ago    Pain Frequency Intermittent    Aggravating Factors  his job requires bending, some lifting pain up to 6-7/10    Pain Relieving Factors rest, icy hot pain can be 0/10    Effect of Pain on Daily Activities difficulty at job              [facility] PT Assessment - 08/15/20 0001      Assessment   Medical Diagnosis LBP    Referring Provider (PT) Janeece Agee, NP    Onset Date/Surgical Date 07/15/20    Prior Therapy no      Precautions   Precautions None      Balance Screen   Has the patient fallen in the past 6 months No  Has the patient had a decrease in activity level because of a fear of falling?  No    Is the patient reluctant to leave their home because of a fear of falling?  No      Home Environment   Additional Comments has stairs, does housework      Prior Function   Level of Independence Independent    Vocation Full time employment    Vocation Requirements reports that his job is mostly standing and he reports that he is bending over most of the day, some lifting up to 25#      Posture/Postural Control   Posture Comments fwd head, rounded shoulders, decreased lordosis      ROM / Strength   AROM / PROM / Strength AROM;Strength      AROM   Overall AROM Comments lumbar ROM decreased 50% with some c/o discomfort      Strength   Overall Strength Comments LE strength 4-/5      Flexibility   Soft Tissue Assessment /Muscle Length yes    Hamstrings mild tightness    Piriformis very tight      Palpation   Palpation comment  tight in the lumbar paraspinals                      Objective measurements completed on examination: See above findings.                 PT Short Term Goals - 08/15/20 0943      PT SHORT TERM GOAL #1   Title independent with initial HEP    Time 2    Period Weeks    Status New             PT Long Term Goals - 08/15/20 0943      PT LONG TERM GOAL #1   Title understand posture and body mechanics    Time 8    Period Weeks    Status New      PT LONG TERM GOAL #2   Title indepednent with advanced HEP    Time 8    Period Weeks    Status New      PT LONG TERM GOAL #3   Title decrease pain at work 50%    Time 8    Period Weeks    Status New                  Plan - 08/15/20 0940    Clinical Impression Statement Patient rpeorts that he has had some pain since 1994.  He reports no recent imaging has been done, I found an x-ray from 2002 that showed DDD.  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  Has some limitationin lumbar ROM, some decreased strength in the LE's and he is very tight in the pririformis mms bilaterally, he is very tight in the lumbar mms    Clinical Decision Making Low    Rehab Potential Good    PT Frequency 2x / week    PT Duration 8 weeks    PT Treatment/Interventions ADLs/Self Care Home Management;Electrical Stimulation;Cryotherapy;Moist Heat;Traction;Ultrasound;Therapeutic activities;Therapeutic exercise;Neuromuscular re-education;Manual techniques;Patient/family education;Dry needling    PT Next Visit Plan slowly start flexibility and stability    Consulted and Agree with Plan of Care Patient           Patient will benefit from skilled therapeutic intervention  in order to improve the following deficits and impairments:  Decreased range of motion,Increased muscle spasms,Pain,Impaired flexibility,Decreased strength,Postural dysfunction,Improper body  mechanics  Visit Diagnosis: Acute bilateral low back pain without sciatica - Plan: PT plan of care cert/re-cert  Muscle spasm of back - Plan: PT plan of care cert/re-cert     Problem List Patient Active Problem List   Diagnosis Date Noted  . Combined arterial insufficiency and corporo-venous occlusive erectile dysfunction 08/22/2016  . Bilateral inguinal hernia 06/01/2015  . Hernia of abdominal cavity 01/03/2015  . Slow transit constipation 01/03/2015  . Esophageal reflux 01/03/2015  . Physical deconditioning 01/02/2015  . STEMI (ST elevation myocardial infarction) (HCC) 01/02/2015  . CAD (coronary artery disease) S/P CABG X 4 01/02/2015  . Essential hypertension 01/02/2015  . Hyperlipidemia 01/02/2015  . Hypercholesteremia 02/18/2012  . Tobacco user 02/18/2012    Jearld Lesch., PT 08/15/2020, 9:56 AM  [facility] [address] [place], [place], [address] Phone: [phone]   Fax:  [phone]  Name: Mico Spark MRN: [id number] Date of Birth: December 07, 1950

## 2020-08-15 NOTE — Patient Instructions (Signed)
Access Code: [email or web address] URL: [email or web address] Date: 08/15/2020 Prepared by: Stacie Glaze  Exercises Hooklying Single Knee to Chest Stretch - 2 x daily - 7 x weekly - 1 sets - 10 reps - 10 hold Supine Double Knee to Chest - 2 x daily - 7 x weekly - 1 sets - 10 reps - 10 hold Supine Lower Trunk Rotation - 2 x daily - 7 x weekly - 1 sets - 10 reps - 10 hold Supine Piriformis Stretch Pulling Heel to Hip - 2 x daily - 7 x weekly - 1 sets - 10 reps - 20 hold

## 2020-08-23 ENCOUNTER — Ambulatory Visit: Payer: 59 | Attending: Registered Nurse | Admitting: Physical Therapy

## 2020-08-23 ENCOUNTER — Other Ambulatory Visit: Payer: Self-pay

## 2020-08-23 DIAGNOSIS — M62830 Muscle spasm of back: Secondary | ICD-10-CM | POA: Diagnosis present

## 2020-08-23 DIAGNOSIS — M545 Low back pain, unspecified: Secondary | ICD-10-CM | POA: Diagnosis not present

## 2020-08-23 NOTE — Therapy (Signed)
[place]. [place], [place], [address] Phone: [phone]   Fax:  [phone]  Physical Therapy Treatment  Patient Details  Name: Alexander Hunter MRN: [id number] Date of Birth: 01-08-1951 Referring Provider (PT): Maximiano Coss, NP   Encounter Date: 08/23/2020   PT End of Session - 08/23/20 0959    Visit Number 2    Date for PT Re-Evaluation 10/13/20    PT Start Time 0930    PT Stop Time 1015    PT Time Calculation (min) 45 min           Past Medical History:  Diagnosis Date  . Back pain    states has been like this for yrs  . Coronary artery disease   . History of blood transfusion 11/2014   no abnormal reaction noted  . HLD (hyperlipidemia)    takes Atorvastatin daily  . Insomnia    doesn't take any meds  . Myocardial infarction ([place]) 11/2014  . Nocturia   . Pneumonia    as a child  . Shortness of breath dyspnea    rarely but notices with exertion.States doesn't happen during cardiac reheab    Past Surgical History:  Procedure Laterality Date  . CARDIAC CATHETERIZATION  12-16-14  . CORONARY ARTERY BYPASS GRAFT  11/2014   x 4-done at [facility]  . HERNIA REPAIR     as a child  . INGUINAL HERNIA REPAIR Bilateral 06/01/2015   Procedure: OPEN  BILATERAL HERNIA REPAIR;  Surgeon: Arta Bruce Kinsinger, MD;  Location: [place];  Service: General;  Laterality: Bilateral;  . INSERTION OF MESH Bilateral 06/01/2015   Procedure: INSERTION OF MESH;  Surgeon: Mickeal Skinner, MD;  Location: [place];  Service: General;  Laterality: Bilateral;  . TONSILLECTOMY AND ADENOIDECTOMY     as a child    There were no vitals filed for this visit.   Subjective Assessment - 08/23/20 0934    Subjective usual pain this morning. HEP going well- they make me feel alot better    Currently in Pain? Yes    Pain Score 2     Pain Location Back                             [facility] Adult PT Treatment/Exercise -  08/23/20 0001      Exercises   Exercises Lumbar;Knee/Hip      Lumbar Exercises: Aerobic   UBE (Upper Arm Bike) L 2 2 min fwd/2 min back    Nustep L 5 5 min      Lumbar Exercises: Standing   Other Standing Lumbar Exercises hip ext and abd red tband 10x BIL with UE support   postural cuing needed   Other Standing Lumbar Exercises shld ext and row 2 sets 10 green tband      Lumbar Exercises: Seated   Sit to Stand 10 reps   with wt ball press   Other Seated Lumbar Exercises trunk ext with tband 2 sets 10      Lumbar Exercises: Supine   Ab Set 10 reps;3 seconds    Bridge Non-compliant;10 reps;3 seconds   feet on ball bridge, KTC and obl     Manual Therapy   Manual Therapy Passive ROM    Passive ROM LE and trunk                    PT Short Term Goals -  08/23/20 1000      PT SHORT TERM GOAL #1   Title independent with initial HEP    Status Achieved             PT Long Term Goals - 08/15/20 0943      PT LONG TERM GOAL #1   Title understand posture and body mechanics    Time 8    Period Weeks    Status New      PT LONG TERM GOAL #2   Title indepednent with advanced HEP    Time 8    Period Weeks    Status New      PT LONG TERM GOAL #3   Title decrease pain at work 50%    Time 8    Period Weeks    Status New                 Plan - 08/23/20 1000    Clinical Impression Statement STG met. Pt tolerated initial ther ex porgression well, but did need moderate cuing verb and some tactile for posture and core engagement. pt verb feeling muscles working and he could see where he needed to work more.    PT Treatment/Interventions ADLs/Self Care Home Management;Electrical Stimulation;Cryotherapy;Moist Heat;Traction;Ultrasound;Therapeutic activities;Therapeutic exercise;Neuromuscular re-education;Manual techniques;Patient/family education;Dry needling    PT Next Visit Plan flexibility and stability- add tband to HEP           Patient will benefit from  skilled therapeutic intervention in order to improve the following deficits and impairments:  Decreased range of motion,Increased muscle spasms,Pain,Impaired flexibility,Decreased strength,Postural dysfunction,Improper body mechanics  Visit Diagnosis: Acute bilateral low back pain without sciatica  Muscle spasm of back     Problem List Patient Active Problem List   Diagnosis Date Noted  . Combined arterial insufficiency and corporo-venous occlusive erectile dysfunction 08/22/2016  . Bilateral inguinal hernia 06/01/2015  . Hernia of abdominal cavity 01/03/2015  . Slow transit constipation 01/03/2015  . Esophageal reflux 01/03/2015  . Physical deconditioning 01/02/2015  . STEMI (ST elevation myocardial infarction) ([place]) 01/02/2015  . CAD (coronary artery disease) S/P CABG X 4 01/02/2015  . Essential hypertension 01/02/2015  . Hyperlipidemia 01/02/2015  . Hypercholesteremia 02/18/2012  . Tobacco user 02/18/2012    Iqra Rotundo,ANGIE PTA 08/23/2020, 10:02 AM  [name]. [place], [place], [address] Phone: [phone]   Fax:  [phone]  Name: Alexander Hunter MRN: [id number] Date of Birth: Oct 26, 1950

## 2020-08-26 ENCOUNTER — Other Ambulatory Visit: Payer: Self-pay

## 2020-08-26 ENCOUNTER — Ambulatory Visit: Payer: 59 | Admitting: Physical Therapy

## 2020-08-26 ENCOUNTER — Encounter: Payer: Self-pay | Admitting: Physical Therapy

## 2020-08-26 DIAGNOSIS — M545 Low back pain, unspecified: Secondary | ICD-10-CM

## 2020-08-26 DIAGNOSIS — M62830 Muscle spasm of back: Secondary | ICD-10-CM

## 2020-08-26 NOTE — Therapy (Signed)
[facility] [address] [place], [place], [address] Phone: [phone]   Fax:  [phone]  Physical Therapy Treatment  Patient Details  Name: Alexander Hunter MRN: [id number] Date of Birth: 04/19/1951 Referring Provider (PT): Janeece Agee, NP   Encounter Date: 08/26/2020   PT End of Session - 08/26/20 1011    Visit Number 3    Date for PT Re-Evaluation 10/13/20    Authorization Type UHC    PT Start Time 0931    PT Stop Time 1014    PT Time Calculation (min) 43 min    Activity Tolerance Patient tolerated treatment well    Behavior During Therapy [facility] for tasks assessed/performed           Past Medical History:  Diagnosis Date  . Back pain    states has been like this for yrs  . Coronary artery disease   . History of blood transfusion 11/2014   no abnormal reaction noted  . HLD (hyperlipidemia)    takes Atorvastatin daily  . Insomnia    doesn't take any meds  . Myocardial infarction (HCC) 11/2014  . Nocturia   . Pneumonia    as a child  . Shortness of breath dyspnea    rarely but notices with exertion.States doesn't happen during cardiac reheab    Past Surgical History:  Procedure Laterality Date  . CARDIAC CATHETERIZATION  12-16-14  . CORONARY ARTERY BYPASS GRAFT  11/2014   x 4-done at [facility]  . HERNIA REPAIR     as a child  . INGUINAL HERNIA REPAIR Bilateral 06/01/2015   Procedure: OPEN  BILATERAL HERNIA REPAIR;  Surgeon: De Blanch Kinsinger, MD;  Location: [facility] OR;  Service: General;  Laterality: Bilateral;  . INSERTION OF MESH Bilateral 06/01/2015   Procedure: INSERTION OF MESH;  Surgeon: Rodman Pickle, MD;  Location: [facility] OR;  Service: General;  Laterality: Bilateral;  . TONSILLECTOMY AND ADENOIDECTOMY     as a child    There were no vitals filed for this visit.   Subjective Assessment - 08/26/20 0933    Subjective Not too bad today    Currently in Pain? No/denies                              [facility] Adult PT Treatment/Exercise - 08/26/20 0001      Lumbar Exercises: Stretches   Active Hamstring Stretch Left;Right;3 reps;20 seconds    Single Knee to Chest Stretch Left;Right;3 reps;10 seconds    Double Knee to Chest Stretch 1 rep;10 seconds      Lumbar Exercises: Aerobic   UBE (Upper Arm Bike) 1.5  2 min fwd/2 min back    Nustep L 5 x5 min      Lumbar Exercises: Machines for Strengthening   Cybex Knee Flexion 20lb 2x10      Lumbar Exercises: Standing   Other Standing Lumbar Exercises shld ext and row 2 sets 10 green tband      Lumbar Exercises: Seated   Long Arc Quad on Chair AROM    Sit to Stand 10 reps   x2 holding yellow ball, some reps with OHP     Lumbar Exercises: Supine   Bridge Non-compliant;10 reps;3 seconds    Other Supine Lumbar Exercises LE on Pball bridges, K2C, Oblq                    PT Short Term  Goals - 08/23/20 1000      PT SHORT TERM GOAL #1   Title independent with initial HEP    Status Achieved             PT Long Term Goals - 08/15/20 0943      PT LONG TERM GOAL #1   Title understand posture and body mechanics    Time 8    Period Weeks    Status New      PT LONG TERM GOAL #2   Title indepednent with advanced HEP    Time 8    Period Weeks    Status New      PT LONG TERM GOAL #3   Title decrease pain at work 50%    Time 8    Period Weeks    Status New                 Plan - 08/26/20 1012    Clinical Impression Statement Postural cues needed throughout session. Cues for core engagement needed with supine bridges. Some LE fatigue noted with curls and extensions. Some LLE HS tightness noted compared to R.    Rehab Potential Good    PT Frequency 2x / week    PT Duration 8 weeks    PT Treatment/Interventions ADLs/Self Care Home Management;Electrical Stimulation;Cryotherapy;Moist Heat;Traction;Ultrasound;Therapeutic activities;Therapeutic exercise;Neuromuscular  re-education;Manual techniques;Patient/family education;Dry needling    PT Next Visit Plan flexibility and stability- add tband to HEP           Patient will benefit from skilled therapeutic intervention in order to improve the following deficits and impairments:  Decreased range of motion,Increased muscle spasms,Pain,Impaired flexibility,Decreased strength,Postural dysfunction,Improper body mechanics  Visit Diagnosis: Acute bilateral low back pain without sciatica  Muscle spasm of back     Problem List Patient Active Problem List   Diagnosis Date Noted  . Combined arterial insufficiency and corporo-venous occlusive erectile dysfunction 08/22/2016  . Bilateral inguinal hernia 06/01/2015  . Hernia of abdominal cavity 01/03/2015  . Slow transit constipation 01/03/2015  . Esophageal reflux 01/03/2015  . Physical deconditioning 01/02/2015  . STEMI (ST elevation myocardial infarction) (HCC) 01/02/2015  . CAD (coronary artery disease) S/P CABG X 4 01/02/2015  . Essential hypertension 01/02/2015  . Hyperlipidemia 01/02/2015  . Hypercholesteremia 02/18/2012  . Tobacco user 02/18/2012    Kennon Portela 08/26/2020, 10:14 AM  [facility] [address] [place], [place], [address] Phone: [phone]   Fax:  [phone]  Name: Sohan Potvin MRN: [id number] Date of Birth: [birth year]

## 2020-08-30 ENCOUNTER — Other Ambulatory Visit: Payer: Self-pay

## 2020-08-30 ENCOUNTER — Ambulatory Visit: Payer: 59 | Admitting: Physical Therapy

## 2020-08-30 DIAGNOSIS — M545 Low back pain, unspecified: Secondary | ICD-10-CM

## 2020-08-30 DIAGNOSIS — M62830 Muscle spasm of back: Secondary | ICD-10-CM

## 2020-08-30 NOTE — Therapy (Signed)
[place]. [place], [place], [address] Phone: [phone]   Fax:  [phone]  Physical Therapy Treatment  Patient Details  Name: Alexander Hunter MRN: [id number] Date of Birth: 1950/11/02 Referring Provider (PT): Maximiano Coss, NP   Encounter Date: 08/30/2020   PT End of Session - 08/30/20 1005    Visit Number 4    Date for PT Re-Evaluation 10/13/20    PT Start Time 0930    PT Stop Time 7116    PT Time Calculation (min) 45 min           Past Medical History:  Diagnosis Date  . Back pain    states has been like this for yrs  . Coronary artery disease   . History of blood transfusion 11/2014   no abnormal reaction noted  . HLD (hyperlipidemia)    takes Atorvastatin daily  . Insomnia    doesn't take any meds  . Myocardial infarction ([place]) 11/2014  . Nocturia   . Pneumonia    as a child  . Shortness of breath dyspnea    rarely but notices with exertion.States doesn't happen during cardiac reheab    Past Surgical History:  Procedure Laterality Date  . CARDIAC CATHETERIZATION  12-16-14  . CORONARY ARTERY BYPASS GRAFT  11/2014   x 4-done at [facility]  . HERNIA REPAIR     as a child  . INGUINAL HERNIA REPAIR Bilateral 06/01/2015   Procedure: OPEN  BILATERAL HERNIA REPAIR;  Surgeon: Arta Bruce Kinsinger, MD;  Location: [place];  Service: General;  Laterality: Bilateral;  . INSERTION OF MESH Bilateral 06/01/2015   Procedure: INSERTION OF MESH;  Surgeon: Mickeal Skinner, MD;  Location: [place];  Service: General;  Laterality: Bilateral;  . TONSILLECTOMY AND ADENOIDECTOMY     as a child    There were no vitals filed for this visit.   Subjective Assessment - 08/30/20 0932    Subjective feeling better, pain comes and goes. working on posture    Currently in Pain? No/denies                             [facility] Adult PT Treatment/Exercise - 08/30/20 0001      Lumbar Exercises: Aerobic    UBE (Upper Arm Bike) L 2 2 min fwd/2 min backward- done in standing with cues for upright posture      Lumbar Exercises: Standing   Other Standing Lumbar Exercises cable pulleys 10 # shld ext,row ( high and low ) and horz abd 2 sets 10      Lumbar Exercises: Supine   Bent Knee Raise 20 reps   tband   Bridge with Ball Squeeze Compliant;10 reps;3 seconds    Bridge with clamshell Compliant;10 reps;3 seconds      Knee/Hip Exercises: Standing   Lateral Step Up Both;10 reps;Step Height: 6";Hand Hold: 1   opp leg abd   Forward Step Up Both;10 reps;Hand Hold: 2;Step Height: 6"   opp leg ext- postural cuing needed   Walking with Sports Cord 30# 5 x 4 way- min A and postural cuing needed      Manual Therapy   Manual Therapy Passive ROM    Manual therapy comments most relief with trunk rotation    Passive ROM LE and trunk                  PT Education - 08/30/20  66    Education Details tband scap stab and hip ext and abd    Person(s) Educated Patient    Methods Explanation;Demonstration;Handout    Comprehension Verbalized understanding;Returned demonstration            PT Short Term Goals - 08/23/20 1000      PT SHORT TERM GOAL #1   Title independent with initial HEP    Status Achieved             PT Long Term Goals - 08/30/20 1003      PT LONG TERM GOAL #1   Title understand posture and body mechanics    Baseline pt still needs moderate cuing    Status Partially Met      PT LONG TERM GOAL #2   Title indepednent with advanced HEP    Status On-going      PT LONG TERM GOAL #3   Title decrease pain at work 50%    Status On-going                 Plan - 08/30/20 1005    Clinical Impression Statement focuson core stab ex with func standing with moderate cuigng needed to maintain good posture.    PT Treatment/Interventions ADLs/Self Care Home Management;Electrical Stimulation;Cryotherapy;Moist Heat;Traction;Ultrasound;Therapeutic activities;Therapeutic  exercise;Neuromuscular re-education;Manual techniques;Patient/family education;Dry needling    PT Next Visit Plan flexibility and stability           Patient will benefit from skilled therapeutic intervention in order to improve the following deficits and impairments:  Decreased range of motion,Increased muscle spasms,Pain,Impaired flexibility,Decreased strength,Postural dysfunction,Improper body mechanics  Visit Diagnosis: Acute bilateral low back pain without sciatica  Muscle spasm of back     Problem List Patient Active Problem List   Diagnosis Date Noted  . Combined arterial insufficiency and corporo-venous occlusive erectile dysfunction 08/22/2016  . Bilateral inguinal hernia 06/01/2015  . Hernia of abdominal cavity 01/03/2015  . Slow transit constipation 01/03/2015  . Esophageal reflux 01/03/2015  . Physical deconditioning 01/02/2015  . STEMI (ST elevation myocardial infarction) ([place]) 01/02/2015  . CAD (coronary artery disease) S/P CABG X 4 01/02/2015  . Essential hypertension 01/02/2015  . Hyperlipidemia 01/02/2015  . Hypercholesteremia 02/18/2012  . Tobacco user 02/18/2012    Law Corsino,ANGIE PTA  08/30/2020, 10:08 AM  [place]. [place], [place], [address] Phone: [phone]   Fax:  [phone]  Name: Rane Blitch MRN: [id number] Date of Birth: 03/09/1951

## 2020-09-01 ENCOUNTER — Other Ambulatory Visit: Payer: Self-pay

## 2020-09-01 ENCOUNTER — Ambulatory Visit: Payer: 59 | Admitting: Physical Therapy

## 2020-09-01 DIAGNOSIS — M545 Low back pain, unspecified: Secondary | ICD-10-CM | POA: Diagnosis not present

## 2020-09-01 DIAGNOSIS — M62830 Muscle spasm of back: Secondary | ICD-10-CM

## 2020-09-01 NOTE — Therapy (Signed)
[place]. [place], [place], [address] Phone: [phone]   Fax:  [phone]  Physical Therapy Treatment  Patient Details  Name: Alexander Hunter MRN: [id number] Date of Birth: 1951-04-14 Referring Provider (PT): Maximiano Coss, NP   Encounter Date: 09/01/2020   PT End of Session - 09/01/20 1011    Visit Number 5    PT Start Time 6073    PT Stop Time 1012    PT Time Calculation (min) 44 min           Past Medical History:  Diagnosis Date  . Back pain    states has been like this for yrs  . Coronary artery disease   . History of blood transfusion 11/2014   no abnormal reaction noted  . HLD (hyperlipidemia)    takes Atorvastatin daily  . Insomnia    doesn't take any meds  . Myocardial infarction ([place]) 11/2014  . Nocturia   . Pneumonia    as a child  . Shortness of breath dyspnea    rarely but notices with exertion.States doesn't happen during cardiac reheab    Past Surgical History:  Procedure Laterality Date  . CARDIAC CATHETERIZATION  12-16-14  . CORONARY ARTERY BYPASS GRAFT  11/2014   x 4-done at [facility]  . HERNIA REPAIR     as a child  . INGUINAL HERNIA REPAIR Bilateral 06/01/2015   Procedure: OPEN  BILATERAL HERNIA REPAIR;  Surgeon: Arta Bruce Kinsinger, MD;  Location: [place];  Service: General;  Laterality: Bilateral;  . INSERTION OF MESH Bilateral 06/01/2015   Procedure: INSERTION OF MESH;  Surgeon: Mickeal Skinner, MD;  Location: [place];  Service: General;  Laterality: Bilateral;  . TONSILLECTOMY AND ADENOIDECTOMY     as a child    There were no vitals filed for this visit.   Subjective Assessment - 09/01/20 0930    Subjective I was sore after new exercises last time. HEP is good    Currently in Pain? Yes    Pain Score 1     Pain Location Back                             [facility] Adult PT Treatment/Exercise - 09/01/20 0001      Lumbar Exercises: Aerobic   Nustep  L 6 37mn      Lumbar Exercises: Machines for Strengthening   Cybex Lumbar Extension 2 sets 15 black tband    Cybex Knee Extension 2 sets 12 10#    Cybex Knee Flexion 2 sets 12 25#    Other Lumbar Machine Exercise lat pull and seated rows 2 sets 12 25#      Lumbar Exercises: Standing   Heel Raises 15 reps;3 seconds   black bar   Other Standing Lumbar Exercises OH wt ball ext 10x, obl 10x each way    Other Standing Lumbar Exercises opp arm and leg facing wall 20 x alt      Lumbar Exercises: Supine   Other Supine Lumbar Exercises resisted trunk rotation  upper and lower 10 reps each                    PT Short Term Goals - 08/23/20 1000      PT SHORT TERM GOAL #1   Title independent with initial HEP    Status Achieved  PT Long Term Goals - 08/30/20 1003      PT LONG TERM GOAL #1   Title understand posture and body mechanics    Baseline pt still needs moderate cuing    Status Partially Met      PT LONG TERM GOAL #2   Title indepednent with advanced HEP    Status On-going      PT LONG TERM GOAL #3   Title decrease pain at work 50%    Status On-going                 Plan - 09/01/20 1011    Clinical Impression Statement progressed core ex and obliques supine and standing with cuing needed, rotation makes pt back feel better. tolerated machines interventions well but with fatigue. postural cuing given throughout session    PT Next Visit Plan flexibility and stability           Patient will benefit from skilled therapeutic intervention in order to improve the following deficits and impairments:  Decreased range of motion,Increased muscle spasms,Pain,Impaired flexibility,Decreased strength,Postural dysfunction,Improper body mechanics  Visit Diagnosis: Acute bilateral low back pain without sciatica  Muscle spasm of back     Problem List Patient Active Problem List   Diagnosis Date Noted  . Combined arterial insufficiency and  corporo-venous occlusive erectile dysfunction 08/22/2016  . Bilateral inguinal hernia 06/01/2015  . Hernia of abdominal cavity 01/03/2015  . Slow transit constipation 01/03/2015  . Esophageal reflux 01/03/2015  . Physical deconditioning 01/02/2015  . STEMI (ST elevation myocardial infarction) ([place]) 01/02/2015  . CAD (coronary artery disease) S/P CABG X 4 01/02/2015  . Essential hypertension 01/02/2015  . Hyperlipidemia 01/02/2015  . Hypercholesteremia 02/18/2012  . Tobacco user 02/18/2012    Kenia Teagarden,ANGIE PTA 09/01/2020, 10:13 AM  [place]. [place], [place], [address] Phone: [phone]   Fax:  [phone]  Name: Alexander Hunter MRN: [id number] Date of Birth: May 21, 1951

## 2020-09-06 ENCOUNTER — Ambulatory Visit: Payer: 59 | Admitting: Physical Therapy

## 2020-09-06 ENCOUNTER — Other Ambulatory Visit: Payer: Self-pay

## 2020-09-06 DIAGNOSIS — M545 Low back pain, unspecified: Secondary | ICD-10-CM | POA: Diagnosis not present

## 2020-09-06 DIAGNOSIS — M62830 Muscle spasm of back: Secondary | ICD-10-CM

## 2020-09-06 NOTE — Therapy (Signed)
[place]. [place], [place], [address] Phone: [phone]   Fax:  [phone]  Physical Therapy Treatment  Patient Details  Name: Alexander Hunter MRN: [id number] Date of Birth: 1950-07-04 Referring Provider (PT): Maximiano Coss, NP   Encounter Date: 09/06/2020   PT End of Session - 09/06/20 1013    Visit Number 6    Date for PT Re-Evaluation 10/13/20    PT Start Time 0930    PT Stop Time 1016    PT Time Calculation (min) 46 min           Past Medical History:  Diagnosis Date  . Back pain    states has been like this for yrs  . Coronary artery disease   . History of blood transfusion 11/2014   no abnormal reaction noted  . HLD (hyperlipidemia)    takes Atorvastatin daily  . Insomnia    doesn't take any meds  . Myocardial infarction ([place]) 11/2014  . Nocturia   . Pneumonia    as a child  . Shortness of breath dyspnea    rarely but notices with exertion.States doesn't happen during cardiac reheab    Past Surgical History:  Procedure Laterality Date  . CARDIAC CATHETERIZATION  12-16-14  . CORONARY ARTERY BYPASS GRAFT  11/2014   x 4-done at [facility]  . HERNIA REPAIR     as a child  . INGUINAL HERNIA REPAIR Bilateral 06/01/2015   Procedure: OPEN  BILATERAL HERNIA REPAIR;  Surgeon: Arta Bruce Kinsinger, MD;  Location: [place];  Service: General;  Laterality: Bilateral;  . INSERTION OF MESH Bilateral 06/01/2015   Procedure: INSERTION OF MESH;  Surgeon: Mickeal Skinner, MD;  Location: [place];  Service: General;  Laterality: Bilateral;  . TONSILLECTOMY AND ADENOIDECTOMY     as a child    There were no vitals filed for this visit.   Subjective Assessment - 09/06/20 0933    Subjective better and better after each session. stil osme pain at work d/t "hunched" over position    Currently in Pain? No/denies                             [facility] Adult PT Treatment/Exercise - 09/06/20 0001       Lumbar Exercises: Aerobic   UBE (Upper Arm Bike) L 3 3 min fwd/3 min back -standing    Nustep L 6 45mn      Lumbar Exercises: Machines for Strengthening   Cybex Lumbar Extension 2 sets 15 black tband    Leg Press 40# 2 sets 10   calf raises 40# 2 sets 10   Other Lumbar Machine Exercise lat pull and seated rows 2 sets 15 25#      Lumbar Exercises: Supine   Bridge Non-compliant;15 reps;3 seconds    Other Supine Lumbar Exercises resisted trunk rotation  upper and lower 15reps each                    PT Short Term Goals - 08/23/20 1000      PT SHORT TERM GOAL #1   Title independent with initial HEP    Status Achieved             PT Long Term Goals - 08/30/20 1003      PT LONG TERM GOAL #1   Title understand posture and body mechanics    Baseline pt still needs  moderate cuing    Status Partially Met      PT LONG TERM GOAL #2   Title indepednent with advanced HEP    Status On-going      PT LONG TERM GOAL #3   Title decrease pain at work 50%    Status On-going                 Plan - 09/06/20 1013    Clinical Impression Statement pt commented through session he feels ex after 5 days off. overall pt reports imporvement but still needs postural cuing with ex. act trunk rotation relaxes back    PT Next Visit Plan flexibility and stability, func work ex           Patient will benefit from skilled therapeutic intervention in order to improve the following deficits and impairments:  Decreased range of motion,Increased muscle spasms,Pain,Impaired flexibility,Decreased strength,Postural dysfunction,Improper body mechanics  Visit Diagnosis: Acute bilateral low back pain without sciatica  Muscle spasm of back     Problem List Patient Active Problem List   Diagnosis Date Noted  . Combined arterial insufficiency and corporo-venous occlusive erectile dysfunction 08/22/2016  . Bilateral inguinal hernia 06/01/2015  . Hernia of abdominal cavity 01/03/2015   . Slow transit constipation 01/03/2015  . Esophageal reflux 01/03/2015  . Physical deconditioning 01/02/2015  . STEMI (ST elevation myocardial infarction) ([place]) 01/02/2015  . CAD (coronary artery disease) S/P CABG X 4 01/02/2015  . Essential hypertension 01/02/2015  . Hyperlipidemia 01/02/2015  . Hypercholesteremia 02/18/2012  . Tobacco user 02/18/2012    Broderic Bara,ANGIE PTA 09/06/2020, 10:15 AM  [place]. [place], [place], [address] Phone: [phone]   Fax:  [phone]  Name: Alexander Hunter MRN: [id number] Date of Birth: April 16, 1951

## 2020-09-08 ENCOUNTER — Ambulatory Visit: Payer: 59 | Admitting: Physical Therapy

## 2020-09-08 ENCOUNTER — Other Ambulatory Visit: Payer: Self-pay

## 2020-09-08 ENCOUNTER — Encounter: Payer: Self-pay | Admitting: Physical Therapy

## 2020-09-08 DIAGNOSIS — M545 Low back pain, unspecified: Secondary | ICD-10-CM | POA: Diagnosis not present

## 2020-09-08 DIAGNOSIS — M62830 Muscle spasm of back: Secondary | ICD-10-CM

## 2020-09-08 NOTE — Therapy (Signed)
[place]. [place], [place], [address] Phone: [phone]   Fax:  [phone]  Physical Therapy Treatment  Patient Details  Name: Alexander Hunter MRN: [id number] Date of Birth: Sep 13, 1950 Referring Provider (PT): Maximiano Coss, NP   Encounter Date: 09/08/2020   PT End of Session - 09/08/20 1642    Visit Number 7    Date for PT Re-Evaluation 10/13/20    Authorization Type [employer]    PT Start Time 1600    PT Stop Time 1645    PT Time Calculation (min) 45 min    Activity Tolerance Patient tolerated treatment well    Behavior During Therapy [facility] for tasks assessed/performed           Past Medical History:  Diagnosis Date  . Back pain    states has been like this for yrs  . Coronary artery disease   . History of blood transfusion 11/2014   no abnormal reaction noted  . HLD (hyperlipidemia)    takes Atorvastatin daily  . Insomnia    doesn't take any meds  . Myocardial infarction ([place]) 11/2014  . Nocturia   . Pneumonia    as a child  . Shortness of breath dyspnea    rarely but notices with exertion.States doesn't happen during cardiac reheab    Past Surgical History:  Procedure Laterality Date  . CARDIAC CATHETERIZATION  12-16-14  . CORONARY ARTERY BYPASS GRAFT  11/2014   x 4-done at [facility]  . HERNIA REPAIR     as a child  . INGUINAL HERNIA REPAIR Bilateral 06/01/2015   Procedure: OPEN  BILATERAL HERNIA REPAIR;  Surgeon: Arta Bruce Kinsinger, MD;  Location: [place];  Service: General;  Laterality: Bilateral;  . INSERTION OF MESH Bilateral 06/01/2015   Procedure: INSERTION OF MESH;  Surgeon: Mickeal Skinner, MD;  Location: [place];  Service: General;  Laterality: Bilateral;  . TONSILLECTOMY AND ADENOIDECTOMY     as a child    There were no vitals filed for this visit.   Subjective Assessment - 09/08/20 1601    Subjective "Real good, no pain at all"    Currently in Pain? No/denies                              [facility] Adult PT Treatment/Exercise - 09/08/20 0001      Lumbar Exercises: Aerobic   UBE (Upper Arm Bike) L 3 3 min fwd/3 min back -standing    Nustep L 6 27mn      Lumbar Exercises: Machines for Strengthening   Leg Press 40# 2 sets 10    Other Lumbar Machine Exercise lat pull and seated rows 2 sets 10 35#      Lumbar Exercises: Standing   Shoulder Extension 20 reps;Power Tower;Strengthening;Both    Shoulder Extension Limitations 10    Other Standing Lumbar Exercises Functional box lifts work sim 2x10                    PT Short Term Goals - 08/23/20 1000      PT SHORT TERM GOAL #1   Title independent with initial HEP    Status Achieved             PT Long Term Goals - 08/30/20 1003      PT LONG TERM GOAL #1   Title understand posture and body mechanics    Baseline  pt still needs moderate cuing    Status Partially Met      PT LONG TERM GOAL #2   Title indepednent with advanced HEP    Status On-going      PT LONG TERM GOAL #3   Title decrease pain at work 50%    Status On-going                 Plan - 09/08/20 1642    Clinical Impression Statement Pt enters clinic reporting no pain. 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 Cues needed to use LE and maintain neutral spine with the box lifts. increase resistance tolerated with seated rows and lats. Cues for postural awareness given throughout session.    Rehab Potential Good    PT Frequency 2x / week    PT Next Visit Plan Will place pt on 2 week hold cause he is not sure how much therapy will cost.           Patient will benefit from skilled therapeutic intervention in order to improve the following deficits and impairments:  Decreased range of motion,Increased muscle spasms,Pain,Impaired flexibility,Decreased strength,Postural dysfunction,Improper body mechanics  Visit Diagnosis: Muscle spasm of back  Acute bilateral low back pain without sciatica     Problem  List Patient Active Problem List   Diagnosis Date Noted  . Combined arterial insufficiency and corporo-venous occlusive erectile dysfunction 08/22/2016  . Bilateral inguinal hernia 06/01/2015  . Hernia of abdominal cavity 01/03/2015  . Slow transit constipation 01/03/2015  . Esophageal reflux 01/03/2015  . Physical deconditioning 01/02/2015  . STEMI (ST elevation myocardial infarction) ([place]) 01/02/2015  . CAD (coronary artery disease) S/P CABG X 4 01/02/2015  . Essential hypertension 01/02/2015  . Hyperlipidemia 01/02/2015  . Hypercholesteremia 02/18/2012  . Tobacco user 02/18/2012    Scot Jun, PTA 09/08/2020, 4:46 PM  [place]. [place], [place], [address] Phone: [phone]   Fax:  [phone]  Name: Deagen Krass MRN: [id number] Date of Birth: 1951/02/03

## 2020-10-21 ENCOUNTER — Other Ambulatory Visit: Payer: Self-pay

## 2020-10-21 ENCOUNTER — Ambulatory Visit (INDEPENDENT_AMBULATORY_CARE_PROVIDER_SITE_OTHER): Payer: 59

## 2020-10-21 DIAGNOSIS — M2141 Flat foot [pes planus] (acquired), right foot: Secondary | ICD-10-CM

## 2020-10-21 DIAGNOSIS — M779 Enthesopathy, unspecified: Secondary | ICD-10-CM

## 2020-10-21 DIAGNOSIS — M2142 Flat foot [pes planus] (acquired), left foot: Secondary | ICD-10-CM

## 2020-11-15 ENCOUNTER — Telehealth: Payer: Self-pay | Admitting: Podiatry

## 2020-11-15 NOTE — Telephone Encounter (Signed)
2nd pr orthotics in... lvm for pt ok to pick up or if he feels he needs an appt to call to schedule it.

## 2020-11-16 NOTE — Telephone Encounter (Signed)
Pt returned my call and is wanting to pick the orthotics up tomorrow and I told him that was fine and that the insurance did not cover them and he was aware it was 438.00. I told pt we request at least 150.00 down and make payments to pick them up but if he wanted he could pay in full.

## 2020-12-05 NOTE — Progress Notes (Signed)
Patient seen in office today by EJ with [facility] for orthotic casting. Patient advised the office will call when orthotics are ready for pick-up.

## 2021-01-04 ENCOUNTER — Ambulatory Visit (INDEPENDENT_AMBULATORY_CARE_PROVIDER_SITE_OTHER): Payer: 59 | Admitting: Registered Nurse

## 2021-01-04 ENCOUNTER — Other Ambulatory Visit: Payer: Self-pay

## 2021-01-04 VITALS — BP 121/80 | HR 67 | Temp 98.2°F | Resp 18 | Ht 67.0 in | Wt 153.4 lb

## 2021-01-04 DIAGNOSIS — E7800 Pure hypercholesterolemia, unspecified: Secondary | ICD-10-CM

## 2021-01-04 DIAGNOSIS — R0789 Other chest pain: Secondary | ICD-10-CM

## 2021-01-04 MED ORDER — ATORVASTATIN CALCIUM 80 MG PO TABS: ORAL_TABLET | ORAL | 3 refills | Status: DC

## 2021-01-04 NOTE — Patient Instructions (Addendum)
Mr. Alexander Hunter to see you  I have sent an acid reducer to use when the right side chest discomfort comes on  I have sent a referral to cardiology Dr. Alene Mires to get reestablished. I do not think your pain is cardiac in nature but the EKG did show a mild abnormality.   Refill on atorvastatin has been sent  Thank you  [name]     If you have lab work done today you will be contacted with your lab results within the next 2 weeks.  If you have not heard from [place] then please contact us. The fastest way to get your results is to register for My Chart.   IF you received an x-ray today, you will receive an invoice from [facility]. Please contact [facility] at [phone] with questions or concerns regarding your invoice.   IF you received labwork today, you will receive an invoice from [facility]. Please contact [facility] at [phone] with questions or concerns regarding your invoice.   Our billing staff will not be able to assist you with questions regarding bills from these companies.  You will be contacted with the lab results as soon as they are available. The fastest way to get your results is to activate your My Chart account. Instructions are located on the last page of this paperwork. If you have not heard from [place] regarding the results in 2 weeks, please contact this office.

## 2021-03-06 ENCOUNTER — Encounter: Payer: Self-pay | Admitting: Family Medicine

## 2021-03-06 ENCOUNTER — Ambulatory Visit: Payer: 59 | Admitting: Registered Nurse

## 2021-03-06 ENCOUNTER — Other Ambulatory Visit: Payer: Self-pay

## 2021-03-06 ENCOUNTER — Ambulatory Visit (INDEPENDENT_AMBULATORY_CARE_PROVIDER_SITE_OTHER): Payer: 59 | Admitting: Family Medicine

## 2021-03-06 VITALS — BP 136/72 | HR 78 | Temp 98.2°F | Resp 16 | Ht 67.0 in | Wt 150.0 lb

## 2021-03-06 DIAGNOSIS — G8929 Other chronic pain: Secondary | ICD-10-CM

## 2021-03-06 DIAGNOSIS — M25572 Pain in left ankle and joints of left foot: Secondary | ICD-10-CM | POA: Diagnosis not present

## 2021-03-06 MED ORDER — DICLOFENAC SODIUM 1 % EX GEL: 2.0000 g | Freq: Four times a day (QID) | CUTANEOUS | 2 refills | Status: DC

## 2021-03-06 NOTE — Progress Notes (Signed)
Subjective:  Patient ID: Alexander Hunter, male    DOB: Jun 16, 1951  Age: 70 y.o. MRN: [id number]  CC:  Chief Complaint  Patient presents with   Ankle Pain    Pt reports Lt ankle pain, pt foot and ankle has told him that this is arthritis, pt notes takes advil for pain some relief not complete better relief when he had ibuprofen 800 mg and would like this again, has been wearing a brace to help with swelling, pt lifts boxes for work, intermittent pain connected to higher activity levels     HPI [name] presents for   L ankle/foot pain: Has been bothering him past 6 months. No recent fall/injury/changes. Pain in front, now on inside.  Has been evaluated by podiatry - Dr. Charlsie Merles. Temporary relief with cortisone shots. Option of surgery if unbearable. Told had arthritis. Prior ankle fracture in 1990's. Works at [employer]. Large facility, long walks, standing all day 8-10 hours, lifting boxes. Not able to retire soon.  Tx: advil 200mg  qd - helps some, sometimes not as much. Wears brace from foot specialist. Daily. occasional epsom salts - temporary relief. Ibuprofen 800mg  worked better in past. Some relief with tylenol No hx of PUD/stomach bleeding.   Hx of CAD, with CABG x4 in 2016. Cardiology - Dr. . Overdue for office visit. Last seen in 2019. Echo in 2018, stress testing in 2018: Cardiolite stress test  showed no ischemia but his calculated ejection fraction was 37%. echocardiogram ejection fraction of 45-50%.   History Patient Active Problem List   Diagnosis Date Noted   Combined arterial insufficiency and corporo-venous occlusive erectile dysfunction 08/22/2016   Bilateral inguinal hernia 06/01/2015   Hernia of abdominal cavity 01/03/2015   Slow transit constipation 01/03/2015   Esophageal reflux 01/03/2015   Physical deconditioning 01/02/2015   STEMI (ST elevation myocardial infarction) ([facility]) 01/02/2015   CAD (coronary artery disease) S/P CABG X 4 01/02/2015    Essential hypertension 01/02/2015   Hyperlipidemia 01/02/2015   Hypercholesteremia 02/18/2012   Tobacco user 02/18/2012   Past Medical History:  Diagnosis Date   Back pain    states has been like this for yrs   Coronary artery disease    History of blood transfusion 11/2014   no abnormal reaction noted   HLD (hyperlipidemia)    takes Atorvastatin daily   Insomnia    doesn't take any meds   Myocardial infarction ([facility]) 11/2014   Nocturia    Pneumonia    as a child   Shortness of breath dyspnea    rarely but notices with exertion.States doesn't happen during cardiac reheab   Past Surgical History:  Procedure Laterality Date   CARDIAC CATHETERIZATION  12-16-14   CORONARY ARTERY BYPASS GRAFT  11/2014   x 4-done at [facility]   HERNIA REPAIR     as a child   INGUINAL HERNIA REPAIR Bilateral 06/01/2015   Procedure: OPEN  BILATERAL HERNIA REPAIR;  Surgeon: [facility], MD;  Location: [facility] OR;  Service: General;  Laterality: Bilateral;   INSERTION OF MESH Bilateral 06/01/2015   Procedure: INSERTION OF MESH;  Surgeon: [facility], MD;  Location: [facility] OR;  Service: General;  Laterality: Bilateral;   TONSILLECTOMY AND ADENOIDECTOMY     as a child   No Known Allergies Prior to Admission medications   Medication Sig Start Date End Date Taking? Authorizing Provider  aspirin 325 MG tablet Take 325 mg by mouth daily.   Yes [provider]  atorvastatin (LIPITOR) 80 MG tablet Take 1 tablet by mouth each evening. 01/04/21  Yes Janeece Agee, NP  Multiple Vitamin (THERA) TABS Take by mouth.   Yes [provider]  diclofenac (VOLTAREN) 75 MG EC tablet Take 1 tablet (75 mg total) by mouth 2 (two) times daily. Patient not taking: No sig reported 01/13/20   Lenn Sink, DPM  diclofenac (VOLTAREN) 75 MG EC tablet Take 1 tablet (75 mg total) by mouth 2 (two) times daily. Patient not taking: No sig reported 01/18/20   Lenn Sink, DPM  diclofenac Sodium  (VOLTAREN) 1 % GEL Apply 2 g topically 2 (two) times daily. Patient not taking: No sig reported 05/13/19   Janeece Agee, NP  methocarbamol (ROBAXIN) 500 MG tablet Take 1 tablet (500 mg total) by mouth 2 (two) times daily as needed for muscle spasms. Patient not taking: No sig reported 08/02/20   Janeece Agee, NP  predniSONE (DELTASONE) 20 MG tablet Take 1 tablet (20 mg total) by mouth daily with breakfast. Patient not taking: No sig reported 08/02/20   Janeece Agee, NP   Social History   Socioeconomic History   Marital status: Divorced    Spouse name: Not on file   Number of children: Not on file   Years of education: Not on file   Highest education level: Not on file  Occupational History   Occupation: Hotel manager  Tobacco Use   Smoking status: Former   Smokeless tobacco: Never   Tobacco comments:    quit smoking 12/2014  Substance and Sexual Activity   Alcohol use: Yes    Alcohol/week: 0.0 standard drinks    Comment: beer 6-12 pk/wk;3-4 mixed drinks a week   Drug use: No   Sexual activity: Not on file  Other Topics Concern   Not on file  Social History Narrative   Not on file   Social Determinants of Health   Financial Resource Strain: Not on file  Food Insecurity: Not on file  Transportation Needs: Not on file  Physical Activity: Not on file  Stress: Not on file  Social Connections: Not on file  Intimate Partner Violence: Not on file    Review of Systems  Per HPI.  Objective:   Vitals:   03/06/21 1150  BP: 136/72  Pulse: 78  Resp: 16  Temp: 98.2 F (36.8 C)  TempSrc: Temporal  SpO2: 100%  Weight: 150 lb (68 kg)  Height: 5\' 7"  (1.702 m)     Physical Exam Vitals reviewed.  Constitutional:      General: He is not in acute distress.    Appearance: Normal appearance. He is well-developed.  HENT:     Head: Normocephalic and atraumatic.  Cardiovascular:     Rate and Rhythm: Normal rate.  Pulmonary:     Effort: Pulmonary effort is  normal.  Musculoskeletal:     Comments: L ankle: Skin intact, no wounds, NVI distally. No edema/effusion. Decreased inversion/eversion greater than flex/ext.  No bony ttp.  Ambulating without difficulty.   Neurological:     Mental Status: He is alert and oriented to person, place, and time.  Psychiatric:        Mood and Affect: Mood normal.      Assessment & Plan:  Daysean Tinkham is a 70 y.o. male . Chronic pain of left ankle - Plan: diclofenac Sodium (VOLTAREN) 1 % GEL  - chronic ankle pain with reported DJD under care of podiatry.  -with hx of CAD, CABG, decreased EF,  recommended against oral nsaids. Also recommended follow up with cardiology -trial of voltaren topical if needed, tylenol, continue brace. Option of turmeric, glucosamine, and rtc/podiatry follow up precautions.     Meds ordered this encounter  Medications   diclofenac Sodium (VOLTAREN) 1 % GEL    Sig: Apply 2 g topically 4 (four) times daily.    Dispense:  100 g    Refill:  2   Patient Instructions  Stop advil, and follow up with your cardiologist.  Rip Harbour to take tylenol over the counter and wear brace.  Can try diclofenac gel up to 4 times per day if needed, but discuss this with your cardiologist as well.  Turmeric supplement and glucosamine/chondroitin supplements over the counter may help arthritis pain. If not helping in 6 weeks, then can discontinue.  Follow up with foot specialist if these approaches do not help, but let me know if there are questions.   Return to the clinic or go to the nearest emergency room if any of your symptoms worsen or new symptoms occur.      Signed,   Meredith Staggers, MD [facility], [facility] 03/06/21 12:44 PM

## 2021-03-06 NOTE — Patient Instructions (Signed)
Stop advil, and follow up with your cardiologist.  Rip Harbour to take tylenol over the counter and wear brace.  Can try diclofenac gel up to 4 times per day if needed, but discuss this with your cardiologist as well.  Turmeric supplement and glucosamine/chondroitin supplements over the counter may help arthritis pain. If not helping in 6 weeks, then can discontinue.  Follow up with foot specialist if these approaches do not help, but let me know if there are questions.   Return to the clinic or go to the nearest emergency room if any of your symptoms worsen or new symptoms occur.

## 2021-03-20 ENCOUNTER — Encounter: Payer: Self-pay | Admitting: Podiatry

## 2021-03-31 ENCOUNTER — Other Ambulatory Visit: Payer: Self-pay

## 2021-03-31 ENCOUNTER — Other Ambulatory Visit: Payer: 59

## 2021-04-03 ENCOUNTER — Encounter: Payer: Self-pay | Admitting: Podiatry

## 2021-04-03 ENCOUNTER — Ambulatory Visit (INDEPENDENT_AMBULATORY_CARE_PROVIDER_SITE_OTHER): Payer: 59 | Admitting: Podiatry

## 2021-04-03 ENCOUNTER — Other Ambulatory Visit: Payer: Self-pay

## 2021-04-03 ENCOUNTER — Ambulatory Visit (INDEPENDENT_AMBULATORY_CARE_PROVIDER_SITE_OTHER): Payer: 59

## 2021-04-03 DIAGNOSIS — M779 Enthesopathy, unspecified: Secondary | ICD-10-CM | POA: Diagnosis not present

## 2021-04-03 DIAGNOSIS — M19079 Primary osteoarthritis, unspecified ankle and foot: Secondary | ICD-10-CM | POA: Diagnosis not present

## 2021-04-03 DIAGNOSIS — M76822 Posterior tibial tendinitis, left leg: Secondary | ICD-10-CM | POA: Diagnosis not present

## 2021-04-03 MED ORDER — MELOXICAM 15 MG PO TABS: 15.0000 mg | ORAL_TABLET | Freq: Every day | ORAL | 2 refills | Status: DC

## 2021-04-03 NOTE — Progress Notes (Signed)
Subjective:   Patient ID: Alexander Hunter, male   DOB: 71 y.o.   MRN: [id number]   HPI Patient states he has a lot of pain in his left ankle and the inside of his left foot and states its been hurting him for the last few months and his work is making it very difficult for him to be able to walk and the pain continues to intensify over this period of time   ROS      Objective:  Physical Exam  Neurovascular status intact with severe loss of motion of the left ankle subtalar joint with inflammation of the subtalar joint and discomfort around the posterior tibial secondary to collapse medial longitudinal arch     Assessment:  Acute inflammatory condition left with weightbearing on cement floors a contributing factor     Plan:  H&P reviewed x-rays condition with patient.  At this point due to the acute and tenseness of discomfort were going to keep him off work for 4 weeks to try to let it settle down and utilize an oral anti-inflammatory along with aggressive ice.  May ultimately require other treatments may require MRI CT with eventual fusion and I did make him aware that and we will see and also steroid injections may be of benefit long-term  X-rays indicate that there is significant collapse medial longitudinal arch the talus is collapsed and there is severe ankle arthritis left

## 2021-04-09 NOTE — Progress Notes (Signed)
Established Patient Office Visit  Subjective:  Patient ID: Alexander Hunter, male    DOB: July 29, 1950  Age: 70 y.o. MRN: [id number]  CC:  Chief Complaint  Patient presents with   Chest Pain    Patient states he has been having some chest pain that comes and goes on the right side that comes and goes.    HPI [name] presents for R side chest pain Intermittent No clear aggravating factors. May be msk in origin - notes he has been more active lately. No other CV symptoms - denies palpitations, headaches, lightheadedness/loc, claudication, and dependent edema  Past Medical History:  Diagnosis Date   Back pain    states has been like this for yrs   Coronary artery disease    History of blood transfusion 11/2014   no abnormal reaction noted   HLD (hyperlipidemia)    takes Atorvastatin daily   Insomnia    doesn't take any meds   Myocardial infarction (HCC) 11/2014   Nocturia    Pneumonia    as a child   Shortness of breath dyspnea    rarely but notices with exertion.States doesn't happen during cardiac reheab    Past Surgical History:  Procedure Laterality Date   CARDIAC CATHETERIZATION  12-16-14   CORONARY ARTERY BYPASS GRAFT  11/2014   x 4-done at [facility]   HERNIA REPAIR     as a child   INGUINAL HERNIA REPAIR Bilateral 06/01/2015   Procedure: OPEN  BILATERAL HERNIA REPAIR;  Surgeon: Rodman Pickle, MD;  Location: [facility] OR;  Service: General;  Laterality: Bilateral;   INSERTION OF MESH Bilateral 06/01/2015   Procedure: INSERTION OF MESH;  Surgeon: Rodman Pickle, MD;  Location: [facility] OR;  Service: General;  Laterality: Bilateral;   TONSILLECTOMY AND ADENOIDECTOMY     as a child    Family History  Problem Relation Age of Onset   Heart attack Mother    Heart disease Mother        Heart attack   Heart attack Father    Heart disease Father        Heart attack   Heart attack Maternal Grandmother    Heart disease Maternal Grandmother        Heart attack    Heart attack Maternal Grandfather    Heart disease Maternal Grandfather        Heart attack    Social History   Socioeconomic History   Marital status: Divorced    Spouse name: Not on file   Number of children: Not on file   Years of education: Not on file   Highest education level: Not on file  Occupational History   Occupation: Hotel manager  Tobacco Use   Smoking status: Former   Smokeless tobacco: Never   Tobacco comments:    quit smoking 12/2014  Substance and Sexual Activity   Alcohol use: Yes    Alcohol/week: 0.0 standard drinks    Comment: beer 6-12 pk/wk;3-4 mixed drinks a week   Drug use: No   Sexual activity: Not on file  Other Topics Concern   Not on file  Social History Narrative   Not on file   Social Determinants of Health   Financial Resource Strain: Not on file  Food Insecurity: Not on file  Transportation Needs: Not on file  Physical Activity: Not on file  Stress: Not on file  Social Connections: Not on file  Intimate Partner Violence: Not on file  Outpatient Medications Prior to Visit  Medication Sig Dispense Refill   aspirin 325 MG tablet Take 325 mg by mouth daily.     Multiple Vitamin (THERA) TABS Take by mouth.     atorvastatin (LIPITOR) 80 MG tablet Take 1 tablet by mouth each evening. 90 tablet 3   diclofenac (VOLTAREN) 75 MG EC tablet Take 1 tablet (75 mg total) by mouth 2 (two) times daily. (Patient not taking: No sig reported) 50 tablet 2   diclofenac (VOLTAREN) 75 MG EC tablet Take 1 tablet (75 mg total) by mouth 2 (two) times daily. (Patient not taking: No sig reported) 30 tablet 0   diclofenac Sodium (VOLTAREN) 1 % GEL Apply 2 g topically 2 (two) times daily. (Patient not taking: No sig reported) 50 g 1   methocarbamol (ROBAXIN) 500 MG tablet Take 1 tablet (500 mg total) by mouth 2 (two) times daily as needed for muscle spasms. (Patient not taking: No sig reported) 20 tablet 0   predniSONE (DELTASONE) 20 MG tablet Take 1 tablet  (20 mg total) by mouth daily with breakfast. (Patient not taking: No sig reported) 4 tablet 0   No facility-administered medications prior to visit.    No Known Allergies  ROS Review of Systems  Constitutional: Negative.   HENT: Negative.    Eyes: Negative.   Respiratory: Negative.    Cardiovascular:  Positive for chest pain. Negative for palpitations and leg swelling.  Gastrointestinal: Negative.   Genitourinary: Negative.   Musculoskeletal: Negative.   Skin: Negative.   Neurological: Negative.   Psychiatric/Behavioral: Negative.    All other systems reviewed and are negative.    Objective:    Physical Exam Constitutional:      General: He is not in acute distress.    Appearance: Normal appearance. He is normal weight. He is not ill-appearing, toxic-appearing or diaphoretic.  Cardiovascular:     Rate and Rhythm: Normal rate and regular rhythm.     Heart sounds: Normal heart sounds. No murmur heard.   No friction rub. No gallop.  Pulmonary:     Effort: Pulmonary effort is normal. No respiratory distress.     Breath sounds: Normal breath sounds. No stridor. No wheezing, rhonchi or rales.  Chest:     Chest wall: No tenderness.  Neurological:     General: No focal deficit present.     Mental Status: He is alert and oriented to person, place, and time. Mental status is at baseline.  Psychiatric:        Mood and Affect: Mood normal.        Behavior: Behavior normal.        Thought Content: Thought content normal.        Judgment: Judgment normal.    BP 121/80   Pulse 67   Temp 98.2 F (36.8 C) (Temporal)   Resp 18   Ht 5\' 7"  (1.702 m)   Wt 153 lb 6.4 oz (69.6 kg)   SpO2 99%   BMI 24.03 kg/m  Wt Readings from Last 3 Encounters:  03/06/21 150 lb (68 kg)  01/04/21 153 lb 6.4 oz (69.6 kg)  08/02/20 153 lb (69.4 kg)     Health Maintenance Due  Topic Date Due   Zoster Vaccines- Shingrix (1 of 2) Never done   COVID-19 Vaccine (4 - Booster for Pfizer series)  10/13/2020    There are no preventive care reminders to display for this patient.  Lab Results  Component Value Date   TSH 1.110  05/13/2019   Lab Results  Component Value Date   WBC 4.9 08/02/2020   HGB 13.7 08/02/2020   HCT 41.8 08/02/2020   MCV 87 08/02/2020   PLT 166 05/13/2019   Lab Results  Component Value Date   NA 138 08/02/2020   K 4.4 08/02/2020   CO2 22 08/02/2020   GLUCOSE 97 08/02/2020   BUN 20 08/02/2020   CREATININE 0.93 08/02/2020   BILITOT 0.5 08/02/2020   ALKPHOS 62 08/02/2020   AST 30 08/02/2020   ALT 28 08/02/2020   PROT 7.1 08/02/2020   ALBUMIN 4.2 08/02/2020   CALCIUM 9.7 08/02/2020   ANIONGAP 7 05/26/2015   Lab Results  Component Value Date   CHOL 209 (H) 08/02/2020   Lab Results  Component Value Date   HDL 102 08/02/2020   Lab Results  Component Value Date   LDLCALC 99 08/02/2020   Lab Results  Component Value Date   TRIG 40 08/02/2020   Lab Results  Component Value Date   CHOLHDL 2.0 08/02/2020   Lab Results  Component Value Date   HGBA1C 6.0 (H) 08/02/2020      Assessment & Plan:   Problem List Items Addressed This Visit       Other   Hypercholesteremia   Relevant Medications   atorvastatin (LIPITOR) 80 MG tablet   Other Visit Diagnoses     Chest discomfort    -  Primary   Relevant Orders   EKG 12-Lead (Completed)       Meds ordered this encounter  Medications   atorvastatin (LIPITOR) 80 MG tablet    Sig: Take 1 tablet by mouth each evening.    Dispense:  90 tablet    Refill:  3    Order Specific Question:   Supervising Provider    Answer:   Neva Seat, JEFFREY R [[id number]]    Follow-up: No follow-ups on file.   PLAN EKG completed. Compared to 08/22/16. No acute changes. Notable for some ectopic beats, likely not contributory to symptoms.  Refill atorvastatin. Return as scheduled. Exam unremarkable. Suspect msk etiology. Reviewed supportive care. Patient encouraged to call clinic with any questions,  comments, or concerns.  Janeece Agee, NP

## 2021-04-17 ENCOUNTER — Telehealth: Payer: Self-pay | Admitting: Podiatry

## 2021-04-17 NOTE — Telephone Encounter (Signed)
Adjusted orthotics in.. lvm for pt ok to pick up.Marland Kitchen

## 2021-05-15 ENCOUNTER — Encounter: Payer: Self-pay | Admitting: Podiatry

## 2021-07-10 ENCOUNTER — Ambulatory Visit (INDEPENDENT_AMBULATORY_CARE_PROVIDER_SITE_OTHER): Payer: 59 | Admitting: Podiatry

## 2021-07-10 ENCOUNTER — Encounter: Payer: Self-pay | Admitting: Podiatry

## 2021-07-10 ENCOUNTER — Other Ambulatory Visit: Payer: Self-pay

## 2021-07-10 DIAGNOSIS — M779 Enthesopathy, unspecified: Secondary | ICD-10-CM

## 2021-07-10 DIAGNOSIS — M19072 Primary osteoarthritis, left ankle and foot: Secondary | ICD-10-CM | POA: Diagnosis not present

## 2021-07-10 NOTE — Progress Notes (Signed)
°  Subjective:  Patient ID: Alexander Hunter, male    DOB: Jun 16, 1951,   MRN: [id number]  Chief Complaint  Patient presents with   Ankle Pain     F/u Lt ankle pain -pt states," pain is the same; 7/10."  -pain varies, w/ swelling -worse if prolong activities Tx: meloxicam and brace    71 y.o. male presents for follow-up of left ankle arthritis. Relates the pain has been doing about the same. Does relates improvement in pain when taking meloxicam. Not sure if the brace he was given is helpful . Denies any other pedal complaints. Denies n/v/f/c.   Past Medical History:  Diagnosis Date   Back pain    states has been like this for yrs   Coronary artery disease    History of blood transfusion 11/2014   no abnormal reaction noted   HLD (hyperlipidemia)    takes Atorvastatin daily   Insomnia    doesn't take any meds   Myocardial infarction (HCC) 11/2014   Nocturia    Pneumonia    as a child   Shortness of breath dyspnea    rarely but notices with exertion.States doesn't happen during cardiac reheab    Objective:  Physical Exam: Vascular: DP/PT pulses 2/4 bilateral. CFT <3 seconds. Normal hair growth on digits. No edema.  Skin. No lacerations or abrasions bilateral feet.  Musculoskeletal: MMT 5/5 bilateral lower extremities in DF, PF, Inversion and Eversion. Deceased ROM in DF of ankle joint. Mildly tender to anterior ankle joint line. Some pain with ROM of the ankle. No pain with palpation to subtalar joint or with ROM on left.  Neurological: Sensation intact to light touch.   Assessment:   1. Arthritis of ankle, left   2. Capsulitis      Plan:  Patient was evaluated and treated and all questions answered. Discussed midfoot arthritis with patient and treatment options.  X-rays reviewed. No acute fractures or dislocations. Degenerative changes noted throughout ankle Discussed NSAIDS, topicals, and possible injections.  Continue meloxicam  Trilock brace dispensed.   Discussed if pain  does not improve can discuss surgical options.  Patient to follow-up as needed.    Louann Sjogren, DPM

## 2021-07-24 ENCOUNTER — Telehealth: Payer: Self-pay | Admitting: Podiatry

## 2021-07-24 NOTE — Telephone Encounter (Signed)
Patient wanted to remain out of work due to left foot pain, not getting any better. Dr. Paulla Dolly released patient to return to work, but he wanted a 2nd opinion. Mr. Villari said he is not able to work on that foot. He has been out of work since September and able to rtw on 06/27/2021. Dr. Paulla Dolly said he didn't see a reason to keep him out. Please advise?

## 2021-07-24 NOTE — Telephone Encounter (Signed)
He was seen on 07/10/2021 and he forgot to ask you about it.

## 2022-04-06 ENCOUNTER — Other Ambulatory Visit: Payer: Self-pay | Admitting: Podiatry

## 2022-04-09 ENCOUNTER — Other Ambulatory Visit: Payer: Self-pay | Admitting: Podiatry

## 2022-04-09 NOTE — Telephone Encounter (Signed)
Patient is requesting his refill of meloxicam, not at pharmacy, will resend to pharmacy on file.

## 2022-04-11 NOTE — Telephone Encounter (Signed)
Thank you :)

## 2022-08-02 ENCOUNTER — Ambulatory Visit (INDEPENDENT_AMBULATORY_CARE_PROVIDER_SITE_OTHER): Payer: HMO

## 2022-08-02 DIAGNOSIS — M76822 Posterior tibial tendinitis, left leg: Secondary | ICD-10-CM

## 2022-08-02 DIAGNOSIS — M2142 Flat foot [pes planus] (acquired), left foot: Secondary | ICD-10-CM

## 2022-08-02 DIAGNOSIS — M2141 Flat foot [pes planus] (acquired), right foot: Secondary | ICD-10-CM

## 2022-08-02 NOTE — Progress Notes (Signed)
Patient presents to the office today for refurbishments of orthotics.   Will send orthotics to lab for adjustments and refurbishments.  Advised patient that she will receive a call from the office to come in for a fitting.

## 2022-08-27 ENCOUNTER — Telehealth: Payer: Self-pay | Admitting: Podiatry

## 2022-08-27 NOTE — Telephone Encounter (Signed)
Lmom to call back to schedule picking up orthotics

## 2022-09-24 ENCOUNTER — Ambulatory Visit (INDEPENDENT_AMBULATORY_CARE_PROVIDER_SITE_OTHER)
Admission: RE | Admit: 2022-09-24 | Discharge: 2022-09-24 | Disposition: A | Discharge status: Home / Self Care | Payer: HMO | Source: Ambulatory Visit | Attending: Family Medicine | Admitting: Family Medicine

## 2022-09-24 ENCOUNTER — Ambulatory Visit (INDEPENDENT_AMBULATORY_CARE_PROVIDER_SITE_OTHER): Payer: HMO | Admitting: Family Medicine

## 2022-09-24 ENCOUNTER — Encounter: Payer: Self-pay | Admitting: Family Medicine

## 2022-09-24 DIAGNOSIS — M19012 Primary osteoarthritis, left shoulder: Secondary | ICD-10-CM | POA: Diagnosis not present

## 2022-09-24 DIAGNOSIS — M79673 Pain in unspecified foot: Secondary | ICD-10-CM

## 2022-09-24 DIAGNOSIS — M25512 Pain in left shoulder: Secondary | ICD-10-CM

## 2022-09-24 DIAGNOSIS — S40021A Contusion of right upper arm, initial encounter: Secondary | ICD-10-CM

## 2022-09-24 DIAGNOSIS — M19011 Primary osteoarthritis, right shoulder: Secondary | ICD-10-CM | POA: Diagnosis not present

## 2022-09-24 MED ORDER — METHOCARBAMOL 500 MG PO TABS
500.0000 mg | ORAL_TABLET | Freq: Three times a day (TID) | ORAL | 0 refills | Status: AC | PRN
Start: 2022-09-24 — End: 2022-09-29
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
## 2022-09-24 NOTE — Patient Instructions (Addendum)
-  Ordered left and right shoulder x-ray. Please go to this location for x-ray: [facility] in 8:30-4:30 during weekdays, no appointment needed [address].  [place], [place] [id number]  When x-ray have resulted, office will call with results and you may see them on MyChart.  -Prescribed Robaxin 500mg  1 tablet every 8 hours for 5 days as needed for pain and muscle spasms. Medication can cause drowsiness, please do not drive or operate machinery while taking.  -Please take Tylenol 1000mg  every 8 hours as needed for pain. If x-rays are negative for fracture, you may add Ibuprofen 600-800mg  every 8 hours. Recommend to eat something when taking Ibuprofen.   -I have highlighted other things you can do for shoulder pain in the provided written material. Also, provided shoulder exercises to do to help keep the stiffness out of shoulder, if x-ray is negative.  -If you develop chest pain or shortness of breath, please go to the closes emergency department.  -If your symptoms do not improve in 1-2 weeks, follow up sooner. Recommend a 2 week appointment to establish care.

## 2022-10-09 ENCOUNTER — Ambulatory Visit (INDEPENDENT_AMBULATORY_CARE_PROVIDER_SITE_OTHER): Payer: HMO | Admitting: Family Medicine

## 2022-10-09 ENCOUNTER — Encounter: Payer: Self-pay | Admitting: Family Medicine

## 2022-10-09 VITALS — BP 122/62 | HR 98 | Temp 98.6°F | Ht 67.0 in | Wt 140.5 lb

## 2022-10-09 DIAGNOSIS — Z13228 Encounter for screening for other metabolic disorders: Secondary | ICD-10-CM | POA: Diagnosis not present

## 2022-10-09 DIAGNOSIS — M25511 Pain in right shoulder: Secondary | ICD-10-CM

## 2022-10-09 DIAGNOSIS — Z1329 Encounter for screening for other suspected endocrine disorder: Secondary | ICD-10-CM

## 2022-10-09 DIAGNOSIS — R7303 Prediabetes: Secondary | ICD-10-CM | POA: Diagnosis not present

## 2022-10-09 DIAGNOSIS — Z87891 Personal history of nicotine dependence: Secondary | ICD-10-CM | POA: Diagnosis not present

## 2022-10-09 DIAGNOSIS — E785 Hyperlipidemia, unspecified: Secondary | ICD-10-CM | POA: Diagnosis not present

## 2022-10-09 DIAGNOSIS — E559 Vitamin D deficiency, unspecified: Secondary | ICD-10-CM | POA: Diagnosis not present

## 2022-10-09 DIAGNOSIS — M25512 Pain in left shoulder: Secondary | ICD-10-CM

## 2022-10-09 DIAGNOSIS — Z7689 Persons encountering health services in other specified circumstances: Secondary | ICD-10-CM | POA: Diagnosis not present

## 2022-10-09 DIAGNOSIS — Z130 Encounter for screening for diseases of the blood and blood-forming organs and certain disorders involving the immune mechanism: Secondary | ICD-10-CM
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
## 2022-10-09 NOTE — Assessment & Plan Note (Addendum)
Continue with Atorvastatin 80mg  daily. Ordered lipid panel and CMP. Patient is fasting today.

## 2022-10-15 ENCOUNTER — Ambulatory Visit (INDEPENDENT_AMBULATORY_CARE_PROVIDER_SITE_OTHER): Payer: HMO | Admitting: Family Medicine

## 2022-10-15 ENCOUNTER — Encounter: Payer: Self-pay | Admitting: Family Medicine

## 2022-10-15 VITALS — BP 122/74 | HR 97 | Temp 98.0°F | Ht 67.0 in | Wt 148.1 lb

## 2022-10-15 DIAGNOSIS — Z0000 Encounter for general adult medical examination without abnormal findings: Secondary | ICD-10-CM

## 2022-10-15 DIAGNOSIS — Z87891 Personal history of nicotine dependence: Secondary | ICD-10-CM | POA: Diagnosis not present

## 2022-10-15 DIAGNOSIS — I25119 Atherosclerotic heart disease of native coronary artery with unspecified angina pectoris: Secondary | ICD-10-CM

## 2022-10-15 NOTE — Progress Notes (Signed)
Subjective:   Alexander Hunter is a 72 y.o. male who presents for a Welcome to Medicare exam.   Review of Systems: Denies chest pain, shortness of breath,and lightheadedness. Denies weight loss, fatigue.  Denies abd pain, nausea, vomiting, diarrhea, constipation. Denies any complications with urination. Reports dizziness when bending over, does not occur all the time.  Cardiac risk factors include: CAD, Hyperlipidemia and MI.     Objective:    Today's Vitals   10/15/22 1137  BP: 122/74  Pulse: 97  Temp: 98 F (36.7 C)  SpO2: 97%  Weight: 148 lb 2 oz (67.2 kg)  Height:  (1.702 m)   Body mass index is 23.2 kg/m.  Medications Outpatient Encounter Medications as of 10/15/2022  Medication Sig   aspirin 325 MG tablet Take 325 mg by mouth daily.   atorvastatin (LIPITOR) 80 MG tablet Take 1 tablet by mouth each evening.   diclofenac Sodium (VOLTAREN) 1 % GEL Apply 2 g topically 4 (four) times daily.   Multiple Vitamin (THERA) TABS Take by mouth.   No facility-administered encounter medications on file as of 10/15/2022.     History: Past Medical History:  Diagnosis Date   Back pain    states has been like this for yrs   Coronary artery disease    History of blood transfusion 11/2014   no abnormal reaction noted   HLD (hyperlipidemia)    takes Atorvastatin daily   Insomnia    doesn't take any meds   Myocardial infarction 11/2014   Nocturia    Pneumonia    as a child   Shortness of breath dyspnea    rarely but notices with exertion.States doesn't happen during cardiac reheab   Past Surgical History:  Procedure Laterality Date   CARDIAC CATHETERIZATION  12-16-14   CORONARY ARTERY BYPASS GRAFT  11/2014   x 4-done at [facility]   HERNIA REPAIR     as a child   INGUINAL HERNIA REPAIR Bilateral 06/01/2015   Procedure: OPEN  BILATERAL HERNIA REPAIR;  Surgeon: Rodman Pickle, MD;  Location: [facility] OR;  Service: General;  Laterality: Bilateral;   INSERTION OF MESH  Bilateral 06/01/2015   Procedure: INSERTION OF MESH;  Surgeon: De Blanch Kinsinger, MD;  Location: [facility] OR;  Service: General;  Laterality: Bilateral;   TONSILLECTOMY AND ADENOIDECTOMY     as a child    Family History  Problem Relation Age of Onset   Heart attack Mother    Heart disease Mother        Heart attack   Heart attack Father    Heart disease Father        Heart attack   Heart failure Brother    COPD Brother    Asthma Son        As a child   Heart attack Maternal Grandmother    Heart disease Maternal Grandmother        Heart attack   Heart attack Maternal Grandfather    Heart disease Maternal Grandfather        Heart attack   Social History   Occupational History   Occupation: Retired  Tobacco Use   Smoking status: Former    Packs/day: 1.00    Years: 30.00    Additional pack years: 0.00    Total pack years: 30.00    Types: Cigarettes    Quit date: 2016    Years since quitting: 8.3   Smokeless tobacco: Never   Tobacco comments:  Quit smoking 12/2014  Vaping Use   Vaping Use: Never used  Substance and Sexual Activity   Alcohol use: Yes    Comment: 1-2 mixed drinks daily, 2 beers daily   Drug use: No   Sexual activity: Not Currently   Tobacco Counseling Counseling given: No Tobacco comments: Quit smoking 12/2014  Immunizations and Health Maintenance Immunization History  Administered Date(s) Administered   PFIZER(Purple Top)SARS-COV-2 Vaccination 11/17/2019, 12/08/2019, 06/14/2020   PPD Test 12/30/2014   Pfizer Covid-19 Vaccine Bivalent Booster 54yrs & up 11/17/2019, 12/08/2019, 06/21/2020, 11/24/2020, 04/12/2021   Pneumococcal Conjugate-13 05/13/2019   Pneumococcal Polysaccharide-23 06/02/2015   Tdap 06/16/2009   Health Maintenance Due  Topic Date Due   Lung Cancer Screening  Never done   Zoster Vaccines- Shingrix (1 of 2) Never done   DTaP/Tdap/Td (2 - Td or Tdap) 06/17/2019   COVID-19 Vaccine (9 - 2023-24 season) 02/23/2022    Activities of  Daily Living    10/15/2022    8:47 AM  In your present state of health, do you have any difficulty performing the following activities:  Hearing? 0  Vision? 0  Difficulty concentrating or making decisions? 0  Walking or climbing stairs? 0  Dressing or bathing? 0  Doing errands, shopping? 0  Preparing Food and eating ? N  Using the Toilet? N  Do you have problems with loss of bowel control? N  Managing your Medications? N  Managing your Finances? N  Housekeeping or managing your Housekeeping? N    Physical Exam   General: He is not in acute distress.  Appearance: Normal appearance. He is not ill-appearing, toxic-appearing, or diaphoretic.  Cardiovascular: Rate and Rhythm: Normal and regular rhythm. Heart sounds:Normal heart sounds. No murmur heard. No friction rub. No gallop.  Pulmonary:Effort:Pulmonary effort is normal. No respiratory distress. Breath sounds: Normal breath sounds.  Musculoskeletal: Normal range of motion. Skin:Warm and dry Neurological: No focal deficit present. Mental status at baseline.  Psychiatric:Mood normal. Behavior normal. Thought content normal. Judgement normal.   Advanced Directives: Does Patient Have a Medical Advance Directive?: Yes Type of Advance Directive: Healthcare Power of Attorney, Living will Copy of Healthcare Power of Attorney in Chart?: No - copy requested    Assessment:    This is a routine wellness  examination for this patient .   Vision/Hearing screen Hearing Screening   500Hz  1000Hz  2000Hz  4000Hz   Right ear yes yes yes yes  Left ear yes yes yes yes   Vision Screening   Right eye Left eye Both eyes  Without correction 20/30 20/40 20/30   With correction no no no    Dietary issues and exercise activities discussed:  Current Exercise Habits: Home exercise routine, Time (Minutes): 20, Frequency (Times/Week): 3, Weekly Exercise (Minutes/Week): 60, Intensity: Mild, Exercise limited by: None identified   Goals      exercise      Timeframe:  Long-Range Goal Priority:  Medium Start Date:  10/15/2022                           Expected End Date:                       Follow Up Date 10/15/2023    Walk more /Exercise    Why is this important?   When you are ready to manage your nutrition or weight, having a plan and setting goals will help.  Taking small steps to change how you eat  and exercise is a good place to start.    Notes:         Depression Screen    10/15/2022    8:43 AM 10/09/2022    7:46 AM 09/24/2022    2:02 PM 03/06/2021   11:53 AM  PHQ 2/9 Scores  PHQ - 2 Score 0 0 3 0  PHQ- 9 Score 0 0 3      Fall Risk    10/15/2022    8:43 AM  Fall Risk   Falls in the past year? 0  Number falls in past yr: 0  Injury with Fall? 0  Risk for fall due to : No Fall Risks    Cognitive Function    10/15/2022   10:33 AM  MMSE - Mini Mental State Exam  Orientation to time 5  Orientation to Place 5  Registration 3  Attention/ Calculation 5  Recall 3  Language- name 2 objects 2  Language- repeat 1  Language- follow 3 step command 3  Language- read & follow direction 1  Write a sentence 1  Copy design 1  Total score 30        10/15/2022   10:00 AM  6CIT Screen  What Year? 0 points  What month? 0 points  What time? 0 points    Patient Care Team: Alveria Apley, NP as PCP - General (Family Medicine)     Plan:   -EKG completed today since it has been a while since one is on file. EKG is normal sinus rhythm with changes in lead III, V1, V2, and V3 from last EKG on 01/05/2023. Patient last seen [facility] with Dr. Delsa Sale on 10/07/2017. He was suppose to follow up in 1 month, but didn't follow up due to covid. Placed a referral to cardiology. Patient would prefers to stay in the [facility] due to location. -Placed a referral to [facility] for a CT lung screening since he smoked for 30 years, quit in 2016.  -Recommend patient to get his  Shingrix, Tdap, and Covid vaccines at his local pharmacy and have them send a copy of the immunizations.   I have personally reviewed and noted the following in the patient's chart:   Medical and social history Use of alcohol, tobacco or illicit drugs  Current medications and supplements Functional ability and status Nutritional status Physical activity Advanced directives List of other physicians Hospitalizations, surgeries, and ER visits in previous 12 months Vitals Screenings to include cognitive, depression, and falls Referrals and appointments  In addition, I have reviewed and discussed with patient certain preventive protocols, quality metrics, and best practice recommendations. A written personalized care plan for preventive services as well as general preventive health recommendations were provided to patient.  -Has an appointment tomorrow to discuss the dizziness.    Zandra Abts, NP 10/15/2022

## 2022-10-15 NOTE — Patient Instructions (Addendum)
You had your first Welcome to Medicare Exam. For now on, you will have yearly phone calls to complete your annual wellness visit with Medicare.  -Placed a referral to cardiology. If you do not hear about an appointment in 2 weeks, please give the office a call.  -Placed a referral to have CT lung screening. If you do not hear about an appointment in 2 weeks, please give the office a call. -I will see you tomorrow to discuss more about your dizziness.

## 2022-10-16 ENCOUNTER — Ambulatory Visit (INDEPENDENT_AMBULATORY_CARE_PROVIDER_SITE_OTHER): Payer: HMO | Admitting: Family Medicine

## 2022-10-16 ENCOUNTER — Encounter: Payer: Self-pay | Admitting: Family Medicine

## 2022-10-16 VITALS — BP 122/60 | HR 81 | Temp 98.0°F | Ht 67.0 in | Wt 143.4 lb

## 2022-10-16 DIAGNOSIS — R42 Dizziness and giddiness: Secondary | ICD-10-CM

## 2022-10-16 DIAGNOSIS — H6123 Impacted cerumen, bilateral: Secondary | ICD-10-CM | POA: Diagnosis not present

## 2022-10-16 NOTE — Patient Instructions (Addendum)
-  Cleaned out both of ears today.  -Orthostatics are positive. This mostly likely is coming from dehydration. Recommend to increase your fluid intake, preferably water to 64-100oz a day. Do not count alcohol beverages as fluid intake.  -If your dizziness does not improve with increasing fluid intake, follow back up sooner.  -Yesterday, placed a referral to cardiology. You can mentioned this to them at your appointment.
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
## 2022-10-25 ENCOUNTER — Encounter: Payer: Self-pay | Admitting: Rehabilitative and Restorative Service Providers"

## 2022-10-25 ENCOUNTER — Ambulatory Visit (INDEPENDENT_AMBULATORY_CARE_PROVIDER_SITE_OTHER): Payer: HMO | Admitting: Rehabilitative and Restorative Service Providers"

## 2022-10-25 DIAGNOSIS — M25512 Pain in left shoulder: Secondary | ICD-10-CM | POA: Diagnosis not present

## 2022-10-25 DIAGNOSIS — R600 Localized edema: Secondary | ICD-10-CM

## 2022-10-25 DIAGNOSIS — R293 Abnormal posture: Secondary | ICD-10-CM | POA: Diagnosis not present

## 2022-10-25 DIAGNOSIS — M25612 Stiffness of left shoulder, not elsewhere classified: Secondary | ICD-10-CM | POA: Diagnosis not present

## 2022-10-25 DIAGNOSIS — M6281 Muscle weakness (generalized): Secondary | ICD-10-CM

## 2022-10-25 DIAGNOSIS — M25611 Stiffness of right shoulder, not elsewhere classified: Secondary | ICD-10-CM | POA: Diagnosis not present

## 2022-10-25 DIAGNOSIS — M25511 Pain in right shoulder: Secondary | ICD-10-CM

## 2022-10-31 ENCOUNTER — Encounter: Payer: Self-pay | Admitting: Rehabilitative and Restorative Service Providers"

## 2022-10-31 ENCOUNTER — Ambulatory Visit (INDEPENDENT_AMBULATORY_CARE_PROVIDER_SITE_OTHER): Payer: HMO | Admitting: Rehabilitative and Restorative Service Providers"

## 2022-10-31 DIAGNOSIS — M6281 Muscle weakness (generalized): Secondary | ICD-10-CM | POA: Diagnosis not present

## 2022-10-31 DIAGNOSIS — M25511 Pain in right shoulder: Secondary | ICD-10-CM | POA: Diagnosis not present

## 2022-10-31 DIAGNOSIS — M25611 Stiffness of right shoulder, not elsewhere classified: Secondary | ICD-10-CM

## 2022-10-31 DIAGNOSIS — R600 Localized edema: Secondary | ICD-10-CM | POA: Diagnosis not present

## 2022-10-31 DIAGNOSIS — M25612 Stiffness of left shoulder, not elsewhere classified: Secondary | ICD-10-CM | POA: Diagnosis not present

## 2022-10-31 DIAGNOSIS — M25512 Pain in left shoulder: Secondary | ICD-10-CM | POA: Diagnosis not present

## 2022-10-31 DIAGNOSIS — R293 Abnormal posture: Secondary | ICD-10-CM | POA: Diagnosis not present
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
## 2022-11-07 ENCOUNTER — Ambulatory Visit (INDEPENDENT_AMBULATORY_CARE_PROVIDER_SITE_OTHER): Payer: HMO | Admitting: Rehabilitative and Restorative Service Providers"

## 2022-11-07 ENCOUNTER — Encounter: Payer: Self-pay | Admitting: Rehabilitative and Restorative Service Providers"

## 2022-11-07 DIAGNOSIS — M25612 Stiffness of left shoulder, not elsewhere classified: Secondary | ICD-10-CM

## 2022-11-07 DIAGNOSIS — M25512 Pain in left shoulder: Secondary | ICD-10-CM

## 2022-11-07 DIAGNOSIS — M25511 Pain in right shoulder: Secondary | ICD-10-CM

## 2022-11-07 DIAGNOSIS — M25611 Stiffness of right shoulder, not elsewhere classified: Secondary | ICD-10-CM | POA: Diagnosis not present

## 2022-11-07 DIAGNOSIS — M6281 Muscle weakness (generalized): Secondary | ICD-10-CM

## 2022-11-07 DIAGNOSIS — R600 Localized edema: Secondary | ICD-10-CM

## 2022-11-07 DIAGNOSIS — R293 Abnormal posture: Secondary | ICD-10-CM
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
## 2022-11-09 ENCOUNTER — Encounter: Payer: Self-pay | Admitting: Rehabilitative and Restorative Service Providers"

## 2022-11-09 ENCOUNTER — Ambulatory Visit (INDEPENDENT_AMBULATORY_CARE_PROVIDER_SITE_OTHER): Payer: HMO | Admitting: Rehabilitative and Restorative Service Providers"

## 2022-11-09 DIAGNOSIS — M25511 Pain in right shoulder: Secondary | ICD-10-CM

## 2022-11-09 DIAGNOSIS — R293 Abnormal posture: Secondary | ICD-10-CM

## 2022-11-09 DIAGNOSIS — M25611 Stiffness of right shoulder, not elsewhere classified: Secondary | ICD-10-CM | POA: Diagnosis not present

## 2022-11-09 DIAGNOSIS — M25512 Pain in left shoulder: Secondary | ICD-10-CM | POA: Diagnosis not present

## 2022-11-09 DIAGNOSIS — R600 Localized edema: Secondary | ICD-10-CM

## 2022-11-09 DIAGNOSIS — M25612 Stiffness of left shoulder, not elsewhere classified: Secondary | ICD-10-CM

## 2022-11-09 DIAGNOSIS — M6281 Muscle weakness (generalized): Secondary | ICD-10-CM | POA: Diagnosis not present
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
## 2022-11-14 ENCOUNTER — Encounter: Payer: HMO | Admitting: Rehabilitative and Restorative Service Providers"

## 2022-11-16 ENCOUNTER — Ambulatory Visit (INDEPENDENT_AMBULATORY_CARE_PROVIDER_SITE_OTHER): Payer: HMO | Admitting: Rehabilitative and Restorative Service Providers"

## 2022-11-16 ENCOUNTER — Encounter: Payer: Self-pay | Admitting: Rehabilitative and Restorative Service Providers"

## 2022-11-16 DIAGNOSIS — M25611 Stiffness of right shoulder, not elsewhere classified: Secondary | ICD-10-CM

## 2022-11-16 DIAGNOSIS — M25511 Pain in right shoulder: Secondary | ICD-10-CM

## 2022-11-16 DIAGNOSIS — R600 Localized edema: Secondary | ICD-10-CM

## 2022-11-16 DIAGNOSIS — M25512 Pain in left shoulder: Secondary | ICD-10-CM

## 2022-11-16 DIAGNOSIS — M25612 Stiffness of left shoulder, not elsewhere classified: Secondary | ICD-10-CM

## 2022-11-16 DIAGNOSIS — M6281 Muscle weakness (generalized): Secondary | ICD-10-CM | POA: Diagnosis not present

## 2022-11-16 DIAGNOSIS — R293 Abnormal posture: Secondary | ICD-10-CM
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
## 2022-11-20 ENCOUNTER — Encounter: Payer: Self-pay | Admitting: Rehabilitative and Restorative Service Providers"

## 2022-11-20 ENCOUNTER — Ambulatory Visit (INDEPENDENT_AMBULATORY_CARE_PROVIDER_SITE_OTHER): Payer: HMO | Admitting: Rehabilitative and Restorative Service Providers"

## 2022-11-20 DIAGNOSIS — M25612 Stiffness of left shoulder, not elsewhere classified: Secondary | ICD-10-CM

## 2022-11-20 DIAGNOSIS — R293 Abnormal posture: Secondary | ICD-10-CM

## 2022-11-20 DIAGNOSIS — M25511 Pain in right shoulder: Secondary | ICD-10-CM

## 2022-11-20 DIAGNOSIS — M6281 Muscle weakness (generalized): Secondary | ICD-10-CM | POA: Diagnosis not present

## 2022-11-20 DIAGNOSIS — R600 Localized edema: Secondary | ICD-10-CM | POA: Diagnosis not present

## 2022-11-20 DIAGNOSIS — M25512 Pain in left shoulder: Secondary | ICD-10-CM | POA: Diagnosis not present

## 2022-11-20 DIAGNOSIS — M25611 Stiffness of right shoulder, not elsewhere classified: Secondary | ICD-10-CM
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
## 2022-11-21 ENCOUNTER — Ambulatory Visit (INDEPENDENT_AMBULATORY_CARE_PROVIDER_SITE_OTHER): Payer: HMO | Admitting: Rehabilitative and Restorative Service Providers"

## 2022-11-21 ENCOUNTER — Encounter: Payer: Self-pay | Admitting: Rehabilitative and Restorative Service Providers"

## 2022-11-21 DIAGNOSIS — M6281 Muscle weakness (generalized): Secondary | ICD-10-CM | POA: Diagnosis not present

## 2022-11-21 DIAGNOSIS — R293 Abnormal posture: Secondary | ICD-10-CM | POA: Diagnosis not present

## 2022-11-21 DIAGNOSIS — M25611 Stiffness of right shoulder, not elsewhere classified: Secondary | ICD-10-CM | POA: Diagnosis not present

## 2022-11-21 DIAGNOSIS — R600 Localized edema: Secondary | ICD-10-CM

## 2022-11-21 DIAGNOSIS — M25612 Stiffness of left shoulder, not elsewhere classified: Secondary | ICD-10-CM | POA: Diagnosis not present

## 2022-11-21 DIAGNOSIS — M25512 Pain in left shoulder: Secondary | ICD-10-CM | POA: Diagnosis not present

## 2022-11-21 DIAGNOSIS — M25511 Pain in right shoulder: Secondary | ICD-10-CM
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
## 2022-11-26 ENCOUNTER — Encounter: Payer: HMO | Admitting: Rehabilitative and Restorative Service Providers"

## 2022-11-26 ENCOUNTER — Ambulatory Visit (INDEPENDENT_AMBULATORY_CARE_PROVIDER_SITE_OTHER): Payer: HMO | Admitting: Rehabilitative and Restorative Service Providers"

## 2022-11-26 ENCOUNTER — Encounter: Payer: Self-pay | Admitting: Rehabilitative and Restorative Service Providers"

## 2022-11-26 DIAGNOSIS — R600 Localized edema: Secondary | ICD-10-CM | POA: Diagnosis not present

## 2022-11-26 DIAGNOSIS — M25512 Pain in left shoulder: Secondary | ICD-10-CM | POA: Diagnosis not present

## 2022-11-26 DIAGNOSIS — M25612 Stiffness of left shoulder, not elsewhere classified: Secondary | ICD-10-CM

## 2022-11-26 DIAGNOSIS — M6281 Muscle weakness (generalized): Secondary | ICD-10-CM | POA: Diagnosis not present

## 2022-11-26 DIAGNOSIS — R293 Abnormal posture: Secondary | ICD-10-CM | POA: Diagnosis not present

## 2022-11-26 DIAGNOSIS — M25611 Stiffness of right shoulder, not elsewhere classified: Secondary | ICD-10-CM

## 2022-11-26 DIAGNOSIS — M25511 Pain in right shoulder: Secondary | ICD-10-CM | POA: Diagnosis not present
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
## 2022-11-30 ENCOUNTER — Ambulatory Visit (INDEPENDENT_AMBULATORY_CARE_PROVIDER_SITE_OTHER): Payer: HMO | Admitting: Cardiology

## 2022-11-30 ENCOUNTER — Encounter (HOSPITAL_BASED_OUTPATIENT_CLINIC_OR_DEPARTMENT_OTHER): Payer: Self-pay | Admitting: Cardiology

## 2022-11-30 VITALS — BP 122/70 | HR 60 | Ht 67.0 in | Wt 142.0 lb

## 2022-11-30 DIAGNOSIS — I255 Ischemic cardiomyopathy: Secondary | ICD-10-CM

## 2022-11-30 DIAGNOSIS — Z7189 Other specified counseling: Secondary | ICD-10-CM | POA: Diagnosis not present

## 2022-11-30 DIAGNOSIS — E7800 Pure hypercholesterolemia, unspecified: Secondary | ICD-10-CM

## 2022-11-30 DIAGNOSIS — Z951 Presence of aortocoronary bypass graft: Secondary | ICD-10-CM

## 2022-11-30 DIAGNOSIS — I2510 Atherosclerotic heart disease of native coronary artery without angina pectoris: Secondary | ICD-10-CM

## 2022-11-30 MED ORDER — ASPIRIN 81 MG PO TBEC
81.0000 mg | DELAYED_RELEASE_TABLET | Freq: Every day | ORAL | 3 refills | Status: AC
Start: 2022-11-30 — End: ?

## 2022-11-30 MED ORDER — LISINOPRIL 5 MG PO TABS: 5.0000 mg | ORAL_TABLET | Freq: Every day | ORAL | 3 refills | Status: AC

## 2022-11-30 NOTE — Patient Instructions (Signed)
Medication Instructions:  Your physician has recommended you make the following change in your medication:   Start: lisinopril 5mg  tablet daily   *If you need a refill on your cardiac medications before your next appointment, please call your pharmacy*   Lab Work: Please return for labs in one month   Follow-Up: At [facility], you and your health needs are our priority.  As part of our continuing mission to provide you with exceptional heart care, we have created designated Provider Care Teams.  These Care Teams include your primary Cardiologist (physician) and Advanced Practice Providers (APPs -  Physician Assistants and Nurse Practitioners) who all work together to provide you with the care you need, when you need it.  We recommend signing up for the patient portal called "MyChart".  Sign up information is provided on this After Visit Summary.  MyChart is used to connect with patients for Virtual Visits (Telemedicine).  Patients are able to view lab/test results, encounter notes, upcoming appointments, etc.  Non-urgent messages can be sent to your provider as well.   To learn more about what you can do with MyChart, go to [email or web address].    Your next appointment:   6 month(s)  Provider:   Jodelle Red, MD

## 2022-11-30 NOTE — Progress Notes (Signed)
Cardiology Office Note:    Date:  11/30/2022   ID:  Alexander Hunter, DOB [birth year], MRN [id number]  PCP:  Alveria Apley, NP  Cardiologist:  Jodelle Red, MD  Referring MD: Alveria Apley, NP   CC: new patient consultation for CAD/prior CABG  History of Present Illness:    Alexander Hunter is a 72 y.o. male with a hx of CABG 2016 ([facility]), ischemic cardiomyopathy who is seen as a new consult at the request of Alveria Apley, NP for the evaluation and management of CAD.  Note from Zandra Abts, NP from 10/15/22 and 10/16/22 reviewed. He has a history of CAD, previously followed by [facility] in 2019. Referred to cardiology for further evaluation.  Ischemic heart disease status post bypass surgery June 2016 (LIMA to the LAD, SVG to ramus, SVG to obtuse marginal, SVG to PDA)   Today: No current symptoms. Reviewed his available records, see below. On aspirin 81 mg daily, atorvastatin 80 mg daily. Reported prior EF 45-50%. Not currently on cardiomyopathy meds. Reviewed his prior history, results at length. Reports that he was on lisinopril before but stopped because he thought it was a blood pressure medication, and his blood pressure has always been good.  Drinks a lot of water, has no issues with fluid retention. Able to be active.  Heart disease and stroke runs throughout his family.   Denies chest pain, shortness of breath at rest or with normal exertion. No PND, orthopnea, LE edema or unexpected weight gain. No syncope or palpitations.   Past Medical History:  Diagnosis Date   Back pain    states has been like this for yrs   Coronary artery disease    History of blood transfusion 11/2014   no abnormal reaction noted   HLD (hyperlipidemia)    takes Atorvastatin daily   Insomnia    doesn't take any meds   Myocardial infarction (HCC) 11/2014   Nocturia    Pneumonia    as a child   Shortness of breath dyspnea    rarely but notices with exertion.States doesn't  happen during cardiac reheab    Past Surgical History:  Procedure Laterality Date   CARDIAC CATHETERIZATION  12-16-14   CORONARY ARTERY BYPASS GRAFT  11/2014   x 4-done at [facility]   HERNIA REPAIR     as a child   INGUINAL HERNIA REPAIR Bilateral 06/01/2015   Procedure: OPEN  BILATERAL HERNIA REPAIR;  Surgeon: Rodman Pickle, MD;  Location: [facility] OR;  Service: General;  Laterality: Bilateral;   INSERTION OF MESH Bilateral 06/01/2015   Procedure: INSERTION OF MESH;  Surgeon: De Blanch Kinsinger, MD;  Location: [facility] OR;  Service: General;  Laterality: Bilateral;   TONSILLECTOMY AND ADENOIDECTOMY     as a child    Current Medications: Current Outpatient Medications on File Prior to Visit  Medication Sig   atorvastatin (LIPITOR) 80 MG tablet Take 1 tablet by mouth each evening.   diclofenac Sodium (VOLTAREN) 1 % GEL Apply 2 g topically 4 (four) times daily.   Multiple Vitamin (THERA) TABS Take by mouth.   No current facility-administered medications on file prior to visit.     Allergies:   Patient has no known allergies.   Social History   Tobacco Use   Smoking status: Former    Packs/day: 1.00    Years: 30.00    Additional pack years: 0.00    Total pack years: 30.00    Types: Cigarettes    Quit  date: 2016    Years since quitting: 8.4   Smokeless tobacco: Never   Tobacco comments:    Quit smoking 12/2014  Vaping Use   Vaping Use: Never used  Substance Use Topics   Alcohol use: Yes    Comment: 1-2 mixed drinks daily, 2 beers daily   Drug use: No    Family History: family history includes Asthma in his son; COPD in his brother; Heart attack in his father, maternal grandfather, maternal grandmother, and mother; Heart disease in his father, maternal grandfather, maternal grandmother, and mother; Heart failure in his brother.  ROS:   Please see the history of present illness.  Additional pertinent ROS: Constitutional: Negative for chills, fever, night sweats,  unintentional weight loss  HENT: Negative for ear pain and hearing loss.   Eyes: Negative for loss of vision and eye pain.  Respiratory: Negative for cough, sputum, wheezing.   Cardiovascular: See HPI. Gastrointestinal: Negative for abdominal pain, melena, and hematochezia.  Genitourinary: Negative for dysuria and hematuria.  Musculoskeletal: Negative for falls and myalgias.  Skin: Negative for itching and rash.  Neurological: Negative for focal weakness, focal sensory changes and loss of consciousness.  Endo/Heme/Allergies: Does not bruise/bleed easily.     EKGs/Labs/Other Studies Reviewed:    The following studies were reviewed today: From care everywhere: Echo 01/03/2017 ([facility]) Stress test 11/14/16 ([facility]) -Reports not available, but per note 03/15/2017 in Care Everywhere, ECHO 12/2016 with EF 45-50%, mild MR and inferior wall hypokinkesis Negative cardiolite stress test, previous scar-inferiorly.   Cath 12/16/2014 ([facility]) IMPRESSION: 1. Failed attempt at PCI of the right coronary artery, unable to cross with any wire after several attempts. 2. Severe three-vessel coronary artery disease. 3. Normal left ventricular end-diastolic pressure of 10 mm Hg. 4. Successful insertion of an intra-aortic balloon pump due to presence of left main disease, as well as completely occluded right coronary artery. 5. Coronary angiography and percutaneous coronary intervention performed with access through the right radial artery.  PLAN: 1. CT surgical evaluation for coronary artery bypass grafting. 2. Medical management of coronary artery disease. 3. Patient to remain in intensive care unit while with intra-aortic balloon pump.  Electronically Signed: Flonnie Hailstone, MD. Narrative  CARDIAC CATHETERIZATION AND PERCUTANEOUS CORONARY INTERVENTION  Operator name: Flonnie Hailstone, MD Patient name: Alexander Hunter   Date of service: 12/16/2014 7:18 PM Primary Care Provider: None on  file  Procedure: CARDIAC CATHETERIZATION  Indication for Procedure: Inferior ST elevation myocardial infarction.  DIAGNOSTIC PROCEDURE NARRATIVE: Patient was identified in the Cath Lab.  A preprocedural pause was performed. The right wrist was prepped and draped in the usual fashion. The patient received Versed and fentanyl for moderate sedation. 1% Xylocaine was used to infiltrate the skin and subcutaneous tissue below. A 60F Slender sheath was inserted into the radial artery to obtain access. The patient was given verapamil 2.5 mg and nitroglycerin 200 mcg IA for arterial patency. The patient was Heparin 5000 IV for anticoagulation.  Equipment - Diagnostic Catheters 72F Tiger 4 5 French JL 3.5  Hemodynamic Measurements Pressures:            - AO  141 / 79.  - LV systolic 144 LV End-diastolic 10.  Angiographic Findings: Left main: Has distal calcified 60% stenosis Left anterior descending: Wraps around the apex, has multiple intermediate sized diagonal branches, has 80% proximal stenosis Left circumflex: Has 40% mid plaque, gives off first OM which has 50% proximal plaque. Right coronary artery: Has 60% diffuse disease in the proximal  and mid segment followed by 99% heavily calcified stenosis in the distal segment. Right dominant  Left ventriculogram: Not done.   INTERVENTIONAL PROCEDURE NARRATIVE:  Intra-operative Anticoagulation: Additional dose of 2000 units heparin was given, ACT was followed  Equipment - Guide Catheters: 6 [place] JR 4 - Guide Wires: Run-through, pilot 50, choice PT.  Procedure Details: I attempted to cross the lesion with a run-through wire, this was unsuccessful. The wire did not cross.  Then, I used a pilot 50, I was unable to cross the lesion. The wire did not cross.  Then, I used a choice PT wire, I was unable to cross. The wire did not cross.  At this time I performed angiography which showed completely occluded artery at the distal segment. I  aborted the procedure.  Due to presence of left main disease, as well as completely occluded RCA, I decided to proceed with insertion of  an intra-aortic balloon pump.  The right groin was prepped and draped in the usual fashion. The patient received Versed and fentanyl for moderate sedation. 1% Xylocaine was used to infiltrate the skin and subcutaneous tissue below the inguinal ligament. A 39F micropuncture catheter was inserted under Fluoroscopy with Ultrasound guidance to obtain access. Permanent ultrasound images were stored on the chart. Right  Iliofemoral angiography was performed. The 39F catheter was exchanged with a balloon pump sheath. Then I inserted the balloon pump under fluoroscopic guidance. After the position of intra-aortic balloon pump was deemed to be adequate, I started augmentation. Adequate augmentation was noted. I sutured the sheath in place. Patient was a started on heparin drip.   EKG:  EKG is personally reviewed.   11/30/22: not ordered today 10/15/22: NSR, IMI, iRBBB  Recent Labs: No results found for requested labs within last 365 days.  Recent Lipid Panel    Component Value Date/Time   CHOL 209 (H) 08/02/2020 1052   TRIG 40 08/02/2020 1052   HDL 102 08/02/2020 1052   CHOLHDL 2.0 08/02/2020 1052   CHOLHDL 2.2 03/11/2015 1551   VLDL 10 03/11/2015 1551   LDLCALC 99 08/02/2020 1052    Physical Exam:    VS:  BP 122/70   Pulse 60   Ht 5\' 7"  (1.702 m)   Wt 142 lb (64.4 kg)   SpO2 98%   BMI 22.24 kg/m     Wt Readings from Last 3 Encounters:  11/30/22 142 lb (64.4 kg)  10/16/22 143 lb 6 oz (65 kg)  10/15/22 148 lb 2 oz (67.2 kg)    GEN: Well nourished, well developed in no acute distress HEENT: Normal, moist mucous membranes NECK: No JVD CARDIAC: regular rhythm, normal S1 and S2, no rubs or gallops. No murmur. VASCULAR: Radial and DP pulses 2+ bilaterally. No carotid bruits RESPIRATORY:  Clear to auscultation without rales, wheezing or rhonchi   ABDOMEN: Soft, non-tender, non-distended MUSCULOSKELETAL:  Ambulates independently SKIN: Warm and dry, no edema NEUROLOGIC:  Alert and oriented x 3. No focal neuro deficits noted. PSYCHIATRIC:  Normal affect    ASSESSMENT:    1. Coronary artery disease involving native coronary artery of native heart without angina pectoris   2. Hx of CABG   3. Ischemic cardiomyopathy   4. Pure hypercholesterolemia   5. Cardiac risk counseling   6. Counseling on health promotion and disease prevention    PLAN:    CAD s/p prior CABG 2016 Ischemic cardiomyopathy Hypercholesterolemia -last LDL 99 in 2022, has pending lipid labs ordered -continue aspirin 81 mg -continue atorvastatin.  If LDL not <70, would consider PCSK9i -on no cardiomyopathy meds. Last EF 45-50% in 2018. He is unclear on what he has been on in the past. HR is 60, unlikely he would tolerate beta blocker. BP is normal at baseline, has been on lisinopril 5 mg in the past but he stopped on his question -NYHA class 1 -check bmet, lipids  Cardiac risk counseling and prevention recommendations: -recommend heart healthy/Mediterranean diet, with whole grains, fruits, vegetable, fish, lean meats, nuts, and olive oil. Limit salt. -recommend moderate walking, 3-5 times/week for 30-50 minutes each session. Aim for at least 150 minutes.week. Goal should be pace of 3 miles/hours, or walking 1.5 miles in 30 minutes -recommend avoidance of tobacco products. Avoid excess alcohol.  Plan for follow up: 6 mos or sooner as needed  Jodelle Red, MD, PhD, [facility] [place]  [facility]  [facility] at [facility] at [facility] [address], [address] [place], [place] [address] [phone]   Medication Adjustments/Labs and Tests Ordered: Current medicines are reviewed at length with the patient today.  Concerns regarding medicines are outlined above.  No orders of the defined types were placed  in this encounter.  Meds ordered this encounter  Medications   aspirin EC 81 MG tablet    Sig: Take 1 tablet (81 mg total) by mouth daily. Swallow whole.    Dispense:  90 tablet    Refill:  3   lisinopril (ZESTRIL) 5 MG tablet    Sig: Take 1 tablet (5 mg total) by mouth daily.    Dispense:  90 tablet    Refill:  3    Patient Instructions  Medication Instructions:  Your physician has recommended you make the following change in your medication:   Start: lisinopril 5mg  tablet daily   *If you need a refill on your cardiac medications before your next appointment, please call your pharmacy*   Lab Work: Please return for labs in one month   Follow-Up: At [facility], you and your health needs are our priority.  As part of our continuing mission to provide you with exceptional heart care, we have created designated Provider Care Teams.  These Care Teams include your primary Cardiologist (physician) and Advanced Practice Providers (APPs -  Physician Assistants and Nurse Practitioners) who all work together to provide you with the care you need, when you need it.  We recommend signing up for the patient portal called "MyChart".  Sign up information is provided on this After Visit Summary.  MyChart is used to connect with patients for Virtual Visits (Telemedicine).  Patients are able to view lab/test results, encounter notes, upcoming appointments, etc.  Non-urgent messages can be sent to your provider as well.   To learn more about what you can do with MyChart, go to [email or web address].    Your next appointment:   6 month(s)  Provider:   Jodelle Red, MD     Signed, Jodelle Red, MD PhD 11/30/2022 1:07 PM    [facility]

## 2022-12-06 ENCOUNTER — Ambulatory Visit (INDEPENDENT_AMBULATORY_CARE_PROVIDER_SITE_OTHER): Payer: HMO | Admitting: Rehabilitative and Restorative Service Providers"

## 2022-12-06 ENCOUNTER — Encounter: Payer: Self-pay | Admitting: Rehabilitative and Restorative Service Providers"

## 2022-12-06 DIAGNOSIS — M6281 Muscle weakness (generalized): Secondary | ICD-10-CM

## 2022-12-06 DIAGNOSIS — R293 Abnormal posture: Secondary | ICD-10-CM

## 2022-12-06 DIAGNOSIS — R600 Localized edema: Secondary | ICD-10-CM
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
## 2022-12-13 ENCOUNTER — Encounter: Payer: Self-pay | Admitting: Rehabilitative and Restorative Service Providers"

## 2022-12-13 ENCOUNTER — Ambulatory Visit (INDEPENDENT_AMBULATORY_CARE_PROVIDER_SITE_OTHER): Payer: HMO | Admitting: Rehabilitative and Restorative Service Providers"

## 2022-12-13 DIAGNOSIS — M25611 Stiffness of right shoulder, not elsewhere classified: Secondary | ICD-10-CM | POA: Diagnosis not present

## 2022-12-13 DIAGNOSIS — R293 Abnormal posture: Secondary | ICD-10-CM | POA: Diagnosis not present

## 2022-12-13 DIAGNOSIS — M25612 Stiffness of left shoulder, not elsewhere classified: Secondary | ICD-10-CM

## 2022-12-13 DIAGNOSIS — M25512 Pain in left shoulder: Secondary | ICD-10-CM

## 2022-12-13 DIAGNOSIS — M25511 Pain in right shoulder: Secondary | ICD-10-CM

## 2022-12-13 DIAGNOSIS — R600 Localized edema: Secondary | ICD-10-CM | POA: Diagnosis not present

## 2022-12-13 DIAGNOSIS — M6281 Muscle weakness (generalized): Secondary | ICD-10-CM | POA: Diagnosis not present
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
## 2022-12-25 ENCOUNTER — Ambulatory Visit (INDEPENDENT_AMBULATORY_CARE_PROVIDER_SITE_OTHER): Payer: HMO | Admitting: Rehabilitative and Restorative Service Providers"

## 2022-12-25 ENCOUNTER — Encounter: Payer: Self-pay | Admitting: Rehabilitative and Restorative Service Providers"

## 2022-12-25 DIAGNOSIS — M25612 Stiffness of left shoulder, not elsewhere classified: Secondary | ICD-10-CM | POA: Diagnosis not present

## 2022-12-25 DIAGNOSIS — M25511 Pain in right shoulder: Secondary | ICD-10-CM

## 2022-12-25 DIAGNOSIS — M25611 Stiffness of right shoulder, not elsewhere classified: Secondary | ICD-10-CM

## 2022-12-25 DIAGNOSIS — R293 Abnormal posture: Secondary | ICD-10-CM | POA: Diagnosis not present

## 2022-12-25 DIAGNOSIS — R600 Localized edema: Secondary | ICD-10-CM

## 2022-12-25 DIAGNOSIS — M25512 Pain in left shoulder: Secondary | ICD-10-CM | POA: Diagnosis not present

## 2022-12-25 DIAGNOSIS — M6281 Muscle weakness (generalized): Secondary | ICD-10-CM | POA: Diagnosis not present
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
## 2023-01-10 ENCOUNTER — Encounter: Payer: HMO | Admitting: Rehabilitative and Restorative Service Providers"

## 2023-02-05 ENCOUNTER — Encounter: Payer: Self-pay | Admitting: *Deleted

## 2023-03-07 ENCOUNTER — Encounter: Payer: Self-pay | Admitting: Family Medicine

## 2023-03-07 ENCOUNTER — Other Ambulatory Visit (HOSPITAL_BASED_OUTPATIENT_CLINIC_OR_DEPARTMENT_OTHER): Payer: Self-pay | Admitting: Family Medicine

## 2023-03-07 ENCOUNTER — Other Ambulatory Visit: Payer: Self-pay | Admitting: Family Medicine

## 2023-03-07 ENCOUNTER — Other Ambulatory Visit (HOSPITAL_BASED_OUTPATIENT_CLINIC_OR_DEPARTMENT_OTHER): Payer: Self-pay | Admitting: Family

## 2023-03-07 DIAGNOSIS — E785 Hyperlipidemia, unspecified: Secondary | ICD-10-CM | POA: Diagnosis not present

## 2023-03-08 LAB — COMPREHENSIVE METABOLIC PANEL
ALT: 19 IU/L (ref 0–44)
AST: 21 IU/L (ref 0–40)
Albumin: 4 g/dL (ref 3.9–4.9)
Alkaline Phosphatase: 56 IU/L (ref 44–121)
BUN/Creatinine Ratio: 13 (ref 10–24)
BUN: 19 mg/dL (ref 8–27)
Bilirubin Total: 0.7 mg/dL (ref 0.0–1.2)
CO2: 20 mmol/L (ref 20–29)
Calcium: 9.9 mg/dL (ref 8.6–10.2)
Chloride: 103 mmol/L (ref 96–106)
Creatinine, Ser: 1.41 mg/dL — ABNORMAL HIGH (ref 0.76–1.27)
Globulin, Total: 3 g/dL (ref 1.5–4.5)
Glucose: 98 mg/dL (ref 70–99)
Potassium: 4.8 mmol/L (ref 3.5–5.2)
Sodium: 137 mmol/L (ref 134–144)
Total Protein: 7 g/dL (ref 6.0–8.5)
eGFR: 54 mL/min/{1.73_m2} — ABNORMAL LOW (ref >59)

## 2023-03-08 LAB — LIPID PANEL W/O CHOL/HDL RATIO
Cholesterol, Total: 246 mg/dL — ABNORMAL HIGH (ref 100–199)
HDL: 106 mg/dL (ref >39)
LDL Chol Calc (NIH): 129 mg/dL — ABNORMAL HIGH (ref 0–99)
Triglycerides: 68 mg/dL (ref 0–149)
VLDL Cholesterol Cal: 11 mg/dL (ref 5–40)

## 2023-03-11 ENCOUNTER — Telehealth: Payer: Self-pay

## 2023-03-11 ENCOUNTER — Other Ambulatory Visit: Payer: Self-pay

## 2023-03-11 DIAGNOSIS — N289 Disorder of kidney and ureter, unspecified: Secondary | ICD-10-CM

## 2023-03-11 NOTE — Telephone Encounter (Signed)
Pt has lab only visit Pt reports he has not been taking Atorvastatin daily, he will start back

## 2023-03-11 NOTE — Telephone Encounter (Signed)
-----   Message from Zandra Abts sent at 03/11/2023  3:12 PM EDT ----- Kidney function is slightly not at goal. Recommend to hydrate with fluids, 64-100oz of water a day. Recheck BMP in 2 weeks (Dx: Decrease kidney function).  Cholesterol and bad (LDL) cholesterol is elevated, recommend to continue taking Atorvastatin. Diet encouraged - increase intake of fresh fruits and vegetables, increase intake of lean proteins. Bake, broil, or grill foods. Avoid fried, greasy, and fatty foods. Avoid fast foods. Increase intake of fiber-rich whole grains. Exercise encouraged - at least 150 minutes per week and advance as tolerated.

## 2023-03-18 ENCOUNTER — Other Ambulatory Visit (INDEPENDENT_AMBULATORY_CARE_PROVIDER_SITE_OTHER): Payer: HMO

## 2023-03-18 DIAGNOSIS — N289 Disorder of kidney and ureter, unspecified: Secondary | ICD-10-CM

## 2023-03-18 LAB — BASIC METABOLIC PANEL
BUN: 19 mg/dL (ref 6–23)
CO2: 25 mEq/L (ref 19–32)
Calcium: 9.8 mg/dL (ref 8.4–10.5)
Chloride: 100 mEq/L (ref 96–112)
Creatinine, Ser: 1.42 mg/dL (ref 0.40–1.50)
GFR: 50.21 mL/min — ABNORMAL LOW (ref >60.00)
Glucose, Bld: 94 mg/dL (ref 70–99)
Potassium: 4.4 mEq/L (ref 3.5–5.1)
Sodium: 135 mEq/L (ref 135–145)

## 2023-03-19 ENCOUNTER — Other Ambulatory Visit: Payer: Self-pay

## 2023-03-19 ENCOUNTER — Telehealth: Payer: Self-pay

## 2023-03-19 DIAGNOSIS — E7800 Pure hypercholesterolemia, unspecified: Secondary | ICD-10-CM

## 2023-03-19 MED ORDER — ATORVASTATIN CALCIUM 80 MG PO TABS
ORAL_TABLET | ORAL | 3 refills | Status: DC
Start: 2023-03-19 — End: 2024-04-27

## 2023-03-19 NOTE — Telephone Encounter (Signed)
-----   Message from Zandra Abts sent at 03/19/2023  8:26 AM EDT ----- Your kidney function is improving. Recommend to continue to hydrating with water.

## 2023-03-25 ENCOUNTER — Other Ambulatory Visit: Payer: HMO

## 2023-04-11 ENCOUNTER — Ambulatory Visit: Payer: HMO | Admitting: Family Medicine

## 2023-04-11 ENCOUNTER — Encounter: Payer: Self-pay | Admitting: Family Medicine

## 2023-04-11 VITALS — BP 128/70 | HR 94 | Temp 98.0°F | Ht 67.0 in | Wt 134.0 lb

## 2023-04-11 DIAGNOSIS — R0989 Other specified symptoms and signs involving the circulatory and respiratory systems: Secondary | ICD-10-CM

## 2023-04-11 DIAGNOSIS — Z0001 Encounter for general adult medical examination with abnormal findings: Secondary | ICD-10-CM

## 2023-04-11 DIAGNOSIS — Z5320 Procedure and treatment not carried out because of patient's decision for unspecified reasons: Secondary | ICD-10-CM

## 2023-04-11 DIAGNOSIS — H6121 Impacted cerumen, right ear: Secondary | ICD-10-CM | POA: Diagnosis not present

## 2023-04-11 NOTE — Patient Instructions (Addendum)
-  Physical exam completed.  -Right ear lavage completed.  -Ordered a vascular study to assess your pulse in your feet. If you do not hear back about an appointment in the next 2 weeks, call back to the office or send a MyChart.  -Recommend to try either Boost and Ensure to help with weight gain and maintain health.  -Recommend Tdap and Zoster vaccine at your local pharmacy.  -Recommend to follow up in 3 weeks for further discussion insomnia.

## 2023-04-11 NOTE — Progress Notes (Signed)
Complete physical exam  Patient: Alexander Hunter   DOB: 08/02/1950   72 y.o. Male  MRN: [id number]  Subjective:    Chief Complaint  Patient presents with   Annual Exam    Alexander Hunter is a 72 y.o. male who presents today for a complete physical exam. He reports consuming a general diet., not eating as much. Participating in home exercises that was recommended from previous physical therapy treatment, 2-3 times a week. He generally feels well. He reports sleeping poorly. He does not have additional problems to discuss today.   Most recent fall risk assessment:    04/11/2023    9:32 AM  Fall Risk   Falls in the past year? 0  Number falls in past yr: 0  Injury with Fall? 0  Risk for fall due to : No Fall Risks  Follow up Falls evaluation completed     Most recent depression screenings:    04/11/2023    9:33 AM 10/15/2022    8:43 AM  PHQ 2/9 Scores  PHQ - 2 Score 0 0  PHQ- 9 Score 2 0   Vision:Not within last year  and Dental: No current dental problems and Receives regular dental care  Past Medical History:  Diagnosis Date   Back pain    states has been like this for yrs   Coronary artery disease    History of blood transfusion 11/2014   no abnormal reaction noted   HLD (hyperlipidemia)    takes Atorvastatin daily   Insomnia    doesn't take any meds   Myocardial infarction (HCC) 11/2014   Nocturia    Pneumonia    as a child   Shortness of breath dyspnea    rarely but notices with exertion.States doesn't happen during cardiac reheab   Past Surgical History:  Procedure Laterality Date   CARDIAC CATHETERIZATION  12-16-14   CORONARY ARTERY BYPASS GRAFT  11/2014   x 4-done at [facility]   HERNIA REPAIR     as a child   INGUINAL HERNIA REPAIR Bilateral 06/01/2015   Procedure: OPEN  BILATERAL HERNIA REPAIR;  Surgeon: Rodman Pickle, MD;  Location: [facility] OR;  Service: General;  Laterality: Bilateral;   INSERTION OF MESH Bilateral 06/01/2015   Procedure: INSERTION OF  MESH;  Surgeon: De Blanch Kinsinger, MD;  Location: [facility] OR;  Service: General;  Laterality: Bilateral;   TONSILLECTOMY AND ADENOIDECTOMY     as a child   Social History   Tobacco Use   Smoking status: Former    Current packs/day: 0.00    Average packs/day: 1 pack/day for 30.0 years (30.0 ttl pk-yrs)    Types: Cigarettes    Start date: 27    Quit date: 2016    Years since quitting: 8.8   Smokeless tobacco: Never   Tobacco comments:    Quit smoking 12/2014  Vaping Use   Vaping status: Never Used  Substance Use Topics   Alcohol use: Yes    Comment: 1 mixed drink a day and 2 beers.   Drug use: No   Social History   Socioeconomic History   Marital status: Single    Spouse name: Not on file   Number of children: 1   Years of education: Not on file   Highest education level: Some college, no degree  Occupational History   Occupation: Retired  Tobacco Use   Smoking status: Former    Current packs/day: 0.00    Average packs/day: 1 pack/day for  30.0 years (30.0 ttl pk-yrs)    Types: Cigarettes    Start date: 25    Quit date: 2016    Years since quitting: 8.8   Smokeless tobacco: Never   Tobacco comments:    Quit smoking 12/2014  Vaping Use   Vaping status: Never Used  Substance and Sexual Activity   Alcohol use: Yes    Comment: 1 mixed drink a day and 2 beers.   Drug use: No   Sexual activity: Not Currently  Other Topics Concern   Not on file  Social History Narrative   Not on file   Social Determinants of Health   Financial Resource Strain: Low Risk  (10/09/2022)   Overall Financial Resource Strain (CARDIA)    Difficulty of Paying Living Expenses: Not hard at all  Food Insecurity: No Food Insecurity (10/15/2022)   Hunger Vital Sign    Worried About Running Out of Food in the Last Year: Never true    Ran Out of Food in the Last Year: Never true  Transportation Needs: No Transportation Needs (10/15/2022)   PRAPARE - Administrator, Civil Service  (Medical): No    Lack of Transportation (Non-Medical): No  Physical Activity: Inactive (10/09/2022)   Exercise Vital Sign    Days of Exercise per Week: 0 days    Minutes of Exercise per Session: 0 min  Stress: No Stress Concern Present (10/09/2022)   [employer] of Occupational Health - Occupational Stress Questionnaire    Feeling of Stress : Not at all  Social Connections: Socially Isolated (10/15/2022)   Social Connection and Isolation Panel [NHANES]    Frequency of Communication with Friends and Family: Once a week    Frequency of Social Gatherings with Friends and Family: Never    Attends Religious Services: Never    Database administrator or Organizations: No    Attends Banker Meetings: Never    Marital Status: Never married  Intimate Partner Violence: Not At Risk (10/15/2022)   Humiliation, Afraid, Rape, and Kick questionnaire    Fear of Current or Ex-Partner: No    Emotionally Abused: No    Physically Abused: No    Sexually Abused: No   Family Status  Relation Name Status   Mother  Deceased   Father  Deceased   Brother  Deceased   Son  Alive   MGM  (Not Specified)   MGF  (Not Specified)  No partnership data on file   Family History  Problem Relation Age of Onset   Heart attack Mother    Heart disease Mother        Heart attack   Heart attack Father    Heart disease Father        Heart attack   Heart failure Brother    COPD Brother    Asthma Son        As a child   Heart attack Maternal Grandmother    Heart disease Maternal Grandmother        Heart attack   Heart attack Maternal Grandfather    Heart disease Maternal Grandfather        Heart attack   No Known Allergies   Patient Care Team: Alveria Apley, NP as PCP - General (Family Medicine) Jodelle Red, MD as PCP - Cardiology (Cardiology)  Last seen 11/30/2022 with a 6 month follow up.   Outpatient Medications Prior to Visit  Medication Sig   aspirin EC 81 MG tablet  Take 1 tablet (81 mg total) by mouth daily. Swallow whole.   atorvastatin (LIPITOR) 80 MG tablet Take 1 tablet by mouth each evening.   diclofenac Sodium (VOLTAREN) 1 % GEL Apply 2 g topically 4 (four) times daily.   Multiple Vitamin (THERA) TABS Take by mouth.   lisinopril (ZESTRIL) 5 MG tablet Take 1 tablet (5 mg total) by mouth daily.   No facility-administered medications prior to visit.   ROS See HPI above    Objective:   BP 128/70 (BP Location: Left Arm, Patient Position: Sitting, Cuff Size: Normal)   Pulse 94   Temp 98 F (36.7 C) (Oral)   Ht 5\' 7"  (1.702 m)   Wt 134 lb (60.8 kg)   SpO2 95%   BMI 20.99 kg/m    Physical Exam Vitals reviewed.  Constitutional:      General: He is not in acute distress.    Appearance: Normal appearance. He is not ill-appearing or toxic-appearing.  HENT:     Head: Normocephalic and atraumatic.     Right Ear: There is impacted cerumen.     Left Ear: Tympanic membrane, ear canal and external ear normal. There is no impacted cerumen.     Mouth/Throat:     Mouth: Mucous membranes are moist.     Pharynx: Oropharynx is clear. No oropharyngeal exudate or posterior oropharyngeal erythema.  Eyes:     General:        Right eye: No discharge.        Left eye: No discharge.     Conjunctiva/sclera: Conjunctivae normal.     Pupils: Pupils are equal, round, and reactive to light.  Neck:     Thyroid: No thyromegaly.  Cardiovascular:     Rate and Rhythm: Normal rate and regular rhythm.     Heart sounds: Normal heart sounds. No murmur heard.    No friction rub. No gallop.     Comments: Unable to palpate pulses in DP on either foot.  Pulmonary:     Effort: Pulmonary effort is normal. No respiratory distress.     Breath sounds: Normal breath sounds.  Abdominal:     General: Abdomen is flat. Bowel sounds are normal.     Palpations: Abdomen is soft.  Musculoskeletal:        General: Normal range of motion.     Cervical back: Normal range of  motion.     Right lower leg: No edema.     Left lower leg: No edema.  Feet:     Right foot:     Toenail Condition: Right toenails are abnormally thick.     Left foot:     Toenail Condition: Left toenails are abnormally thick.     Comments: Discolored toe nails  Lymphadenopathy:     Cervical: No cervical adenopathy.  Skin:    General: Skin is warm and dry.  Neurological:     General: No focal deficit present.     Mental Status: He is alert. Mental status is at baseline.  Psychiatric:        Mood and Affect: Mood normal.        Behavior: Behavior normal.        Thought Content: Thought content normal.        Judgment: Judgment normal.   PRE-PROCEDURE EXAM: Right TM cannot be visualized due to total occlusion/impaction of the ear canal. PROCEDURE INDICATION: Remove wax to visualize ear drum & relieve discomfort CONSENT:  Verbalto  PROCEDURE NOTE: Right ear:  The [name], Elwin Mocha, irrigated right ear with warm water and ear drops to remove the wax. 100% of the wax was removed.  POST- PROCEDURE EXAM: TMs successfully visualized. TM with no erythema. The patient tolerated the procedure well.      Assessment & Plan:    Routine Health Maintenance and Physical Exam  Immunization History  Administered Date(s) Administered   PFIZER(Purple Top)SARS-COV-2 Vaccination 11/17/2019, 12/08/2019, 06/14/2020   PPD Test 12/30/2014   Pfizer Covid-19 Vaccine Bivalent Booster 38yrs & up 11/17/2019, 12/08/2019, 06/21/2020, 11/24/2020, 04/12/2021   Pneumococcal Conjugate-13 05/13/2019   Pneumococcal Polysaccharide-23 06/02/2015   Tdap 06/16/2009    Health Maintenance  Topic Date Due   Lung Cancer Screening  Never done   Zoster Vaccines- Shingrix (1 of 2) Never done   DTaP/Tdap/Td (2 - Td or Tdap) 06/17/2019   COVID-19 Vaccine (9 - 2023-24 season) 02/24/2023   INFLUENZA VACCINE  09/23/2023 (Originally 01/24/2023)   Medicare Annual Wellness (AWV)  10/15/2023   Pneumonia Vaccine 95+ Years old (3  of 3 - PPSV23 or PCV20) 05/12/2024   Colonoscopy  08/22/2026   Hepatitis C Screening  Completed   HPV VACCINES  Aged Out    Discussed health benefits of physical activity, and encouraged him to engage in regular exercise appropriate for his age and condition.  Abnormal physical evaluation  Impacted cerumen of right ear -     Ear Lavage  Abnormal foot pulse -     VAS [place] ABI WITH/WO TBI; Future  1.Reviewed health maintenance:  -Covid booster: 2 months ago [employer], [employer]  -Tdap: Due; recommend to obtain at [facility]  -Zoster vaccine: Due, recommend to obtain at local pharmacy -Influenza vaccine: Declines  -Lung Cancer Screening: Declines  2.Physical exam completed today with some abnormalities.  3. Right ear impacted with cerumen. Right ear lavage completed today.  4.Could not palpate dorsalis pulses in either foot during physical exam. Ordered a vascular ultrasound ABI to examine circulation. Also, he is past tobacco user that affect circulation. 5.Recommend to try either Boost and Ensure to help with weight gain and maintain health.  6.BMP completed on 09/23 with mild decrease GFR and lipids just completed on 09/12.   Return in about 3 weeks (around 05/02/2023) for follow-up: Insomnia .   Zandra Abts, NP

## 2023-05-02 ENCOUNTER — Ambulatory Visit: Payer: HMO | Admitting: Family Medicine

## 2023-05-02 ENCOUNTER — Encounter: Payer: Self-pay | Admitting: Family Medicine

## 2023-05-02 VITALS — BP 116/62 | HR 93 | Temp 98.1°F | Ht 67.0 in | Wt 130.8 lb

## 2023-05-02 DIAGNOSIS — M25572 Pain in left ankle and joints of left foot: Secondary | ICD-10-CM

## 2023-05-02 DIAGNOSIS — M19072 Primary osteoarthritis, left ankle and foot: Secondary | ICD-10-CM

## 2023-05-02 DIAGNOSIS — G8929 Other chronic pain: Secondary | ICD-10-CM | POA: Diagnosis not present

## 2023-05-02 DIAGNOSIS — R351 Nocturia: Secondary | ICD-10-CM

## 2023-05-02 DIAGNOSIS — G4700 Insomnia, unspecified: Secondary | ICD-10-CM | POA: Diagnosis not present

## 2023-05-02 MED ORDER — CELECOXIB 200 MG PO CAPS
200.0000 mg | ORAL_CAPSULE | Freq: Every day | ORAL | 0 refills | Status: DC
Start: 2023-05-02 — End: 2023-05-27

## 2023-05-02 NOTE — Progress Notes (Signed)
Established Patient Office Visit   Subjective:  Patient ID: Alexander Hunter, male    DOB: 28-Nov-1950  Age: 72 y.o. MRN: [id number]  Chief Complaint  Patient presents with   Medical Management of Chronic Issues    HPI Insomnia: Patient is complaining to insomnia.His insomnia started mostly 2023. He reports he has never been on any medications. He reports he is having difficulty staying a sleep versus going to sleep. He reports he can go to sleep with no problems. He reports prior retiring, he was sleeping about 5-6 hours, but after retiring he is only getting about 3-4 hours. He reports he typically goes to bed no later than 11pm with earliest 9pm. He reports he starts waking up around 2-3am. He reports he goes back to sleep, but it takes a while. Usually will go back to sleep within 30 minutes. He feels most of the time he wakes up because he needs to urinate. He has been increasing his water intake after he was told he was dehydrated. He reports he has noticed that he drinks a lot water prior to bed to catch him back up with not drinking as much during the day.  He reports prior to increasing his water intake, he was not waking up as much. He reports he typically wakes up around 5am-7:30am. He reports he feels good when he wakes up. He reports has felt like he needs a nap in afternoon, but usually tries to fight off.   Patient is asking a refill on Celecoxib 200mg  BID. Based on notes, he last Jola Schmidt, [place] with [facility] with [facility] for chronic left ankle pain and arthritis on 02/027/2023. He reports the medication was so effective that he was only taking it once a day. From last visit, PCP could refill medication with monitoring his kidney function.   ROS See HPI above     Objective:   BP 116/62 (BP Location: Left Arm, Patient Position: Sitting, Cuff Size: Normal)   Pulse 93   Temp 98.1 F (36.7 C) (Oral)   Ht 5\' 7"  (1.702 m)   Wt 130 lb  12.8 oz (59.3 kg)   SpO2 99%   BMI 20.49 kg/m    Physical Exam Vitals reviewed.  Constitutional:      General: He is not in acute distress.    Appearance: Normal appearance. He is normal weight. He is not ill-appearing, toxic-appearing or diaphoretic.  HENT:     Head: Normocephalic and atraumatic.  Eyes:     General:        Right eye: No discharge.        Left eye: No discharge.     Conjunctiva/sclera: Conjunctivae normal.  Cardiovascular:     Rate and Rhythm: Normal rate and regular rhythm.     Heart sounds: Normal heart sounds. No murmur heard.    No friction rub. No gallop.  Pulmonary:     Effort: Pulmonary effort is normal. No respiratory distress.     Breath sounds: Normal breath sounds.  Musculoskeletal:        General: Normal range of motion.  Skin:    General: Skin is warm and dry.  Neurological:     General: No focal deficit present.     Mental Status: He is alert and oriented to person, place, and time. Mental status is at baseline.  Psychiatric:        Mood and Affect: Mood normal.  Behavior: Behavior normal.        Thought Content: Thought content normal.        Judgment: Judgment normal.      Assessment & Plan:  Insomnia, unspecified type  Nocturia  Chronic pain of left ankle -     Celecoxib; Take 1 capsule (200 mg total) by mouth daily.  Dispense: 90 capsule; Refill: 0  Arthritis of left ankle -     Celecoxib; Take 1 capsule (200 mg total) by mouth daily.  Dispense: 90 capsule; Refill: 0  -Had a lengthy discussion about his insomnia actually coming from not able to sleep or from nocturia. Suspect if he didn't have to wake up to urinate, he would be able to sleep through the night. Recommend to try getting most of his water intake through the day. Also, try to eat and drink fluids about 2 hours before going to bed. For example, if he is going to bed at 9pm, then try not to drink fluids after 7pm and make sure to urinate right before bed. Lets  follow up in 3 weeks to see if this has helped with waking up during the night.  -Refilled Celecoxib 200mg  tablet, 1 tablet once a day. Advised to please do not take any NSAIDS, such as Ibuprofen, Advil, Aleve, or Naproxen while taking Celecoxib. May use Voltaren gel periodically. Will need to monitor his kidney function periodically. Last kidney function: Creating 1.42 and GFR 50.21 on 03/18/2023.   Return in about 3 weeks (around 05/23/2023).   Zandra Abts, NP

## 2023-05-02 NOTE — Patient Instructions (Addendum)
-  Recommend to try getting most of your water intake through the day. Also, try to eat and drink fluids about 2 hours before going to bed. For example, if you are going to bed at 9pm, then try not to drink fluids after 7pm and make sure to urinate right before bed. Lets follow up in 3 weeks to see if this has helped with waking up during the night.  -Refilled Celecoxib 200mg  tablet, 1 tablet once a day. Please do not take any NSAIDS, such as Ibuprofen, Advil, Aleve, or Naproxen while taking Celecoxib. May use Voltaren gel periodically. Will need to monitor your kidney function periodically.

## 2023-05-06 ENCOUNTER — Ambulatory Visit (HOSPITAL_COMMUNITY)
Admission: RE | Admit: 2023-05-06 | Discharge: 2023-05-06 | Disposition: A | Discharge status: Home / Self Care | Payer: HMO | Source: Ambulatory Visit | Attending: Cardiology | Admitting: Cardiology

## 2023-05-06 ENCOUNTER — Other Ambulatory Visit: Payer: Self-pay | Admitting: Family Medicine

## 2023-05-06 DIAGNOSIS — R0989 Other specified symptoms and signs involving the circulatory and respiratory systems: Secondary | ICD-10-CM | POA: Diagnosis not present

## 2023-05-06 DIAGNOSIS — R6889 Other general symptoms and signs: Secondary | ICD-10-CM

## 2023-05-06 LAB — VAS US ABI WITH/WO TBI: Left ABI: 0.73

## 2023-05-27 ENCOUNTER — Encounter: Payer: Self-pay | Admitting: Family Medicine

## 2023-05-27 ENCOUNTER — Ambulatory Visit: Payer: HMO | Admitting: Family Medicine

## 2023-05-27 VITALS — BP 120/64 | HR 88 | Resp 12 | Ht 67.0 in | Wt 135.0 lb

## 2023-05-27 DIAGNOSIS — I739 Peripheral vascular disease, unspecified: Secondary | ICD-10-CM | POA: Diagnosis not present

## 2023-05-27 DIAGNOSIS — E785 Hyperlipidemia, unspecified: Secondary | ICD-10-CM

## 2023-05-27 DIAGNOSIS — R944 Abnormal results of kidney function studies: Secondary | ICD-10-CM

## 2023-05-27 DIAGNOSIS — M25572 Pain in left ankle and joints of left foot: Secondary | ICD-10-CM | POA: Diagnosis not present

## 2023-05-27 DIAGNOSIS — G8929 Other chronic pain: Secondary | ICD-10-CM

## 2023-05-27 DIAGNOSIS — I25119 Atherosclerotic heart disease of native coronary artery with unspecified angina pectoris: Secondary | ICD-10-CM

## 2023-05-27 MED ORDER — DICLOFENAC SODIUM 1 % EX GEL: 2.0000 g | Freq: Four times a day (QID) | CUTANEOUS | 2 refills | Status: DC

## 2023-05-27 NOTE — Patient Instructions (Addendum)
A few things to remember from today's visit:  Chronic pain of left ankle - Plan: diclofenac Sodium (VOLTAREN) 1 % GEL  PAD (peripheral artery disease) (HCC)  Hyperlipidemia, unspecified hyperlipidemia type Stop Celebrex. Resume aspirin 81 mg. Use topical Voltaren for left ankle. We will plan on repeating fasting labs in 06/2023.  If you need refills for medications you take chronically, please call your pharmacy. Do not use My Chart to request refills or for acute issues that need immediate attention. If you send a my chart message, it may take a few days to be addressed, specially if I am not in the office.  Please be sure medication list is accurate. If a new problem present, please set up appointment sooner than planned today.

## 2023-05-27 NOTE — Progress Notes (Signed)
HPI: Alexander Hunter is a 72 y.o. male with a PMHx significant for STEMI, CAD, HLD, abdominal cavity hernia, and insomnia, who is here today for chronic disease management.  Last seen here on 11/07 by Alexander Abts, NP for left ankle pain and insomnia.   Left ankle pain:  Patient was prescribed Celebrex 200 mg daily on his last visit. He reports he has been taking the medication and it has helped, but notes he sometimes has to take 400 mg.  He has a history of decreased kidney function.  He mentions his arthritis worsens with cold weather.  He has not been using his Voltaren gel.   Peripheral Artery Disease: *** Recently, he had an ABI, so a bilateral ultrasound doppler on the lower extremities was ordered.  He denies any pain while walking, chest pain, SOB, or palpitations.  He doesn't smoke. He has been taking atorvastatin 80 mg daily, but admits there was a gap where he was not taking it recently.  He has not been taking aspirin 81 mg because he thought he was not supposed to take it with his lisinopril 5 mg.   Lab Results  Component Value Date   NA 135 03/18/2023   CL 100 03/18/2023   K 4.4 03/18/2023   CO2 25 03/18/2023   BUN 19 03/18/2023   CREATININE 1.42 03/18/2023   GFR 50.21 (L) 03/18/2023   CALCIUM 9.8 03/18/2023   ALBUMIN 4.0 03/07/2023   GLUCOSE 94 03/18/2023   Lab Results  Component Value Date   CHOL 246 (H) 03/07/2023   HDL 106 03/07/2023   LDLCALC 129 (H) 03/07/2023   TRIG 68 03/07/2023   CHOLHDL 2.0 08/02/2020    Review of Systems See other pertinent positives and negatives in HPI.  Current Outpatient Medications on File Prior to Visit  Medication Sig Dispense Refill   aspirin EC 81 MG tablet Take 1 tablet (81 mg total) by mouth daily. Swallow whole. 90 tablet 3   atorvastatin (LIPITOR) 80 MG tablet Take 1 tablet by mouth each evening. 90 tablet 3   Multiple Vitamin (THERA) TABS Take by mouth.     lisinopril (ZESTRIL) 5 MG tablet Take 1 tablet  (5 mg total) by mouth daily. 90 tablet 3   No current facility-administered medications on file prior to visit.    Past Medical History:  Diagnosis Date   Back pain    states has been like this for yrs   Coronary artery disease    History of blood transfusion 11/2014   no abnormal reaction noted   HLD (hyperlipidemia)    takes Atorvastatin daily   Insomnia    doesn't take any meds   Myocardial infarction (HCC) 11/2014   Nocturia    Pneumonia    as a child   Shortness of breath dyspnea    rarely but notices with exertion.States doesn't happen during cardiac reheab   No Known Allergies  Social History   Socioeconomic History   Marital status: Single    Spouse name: Not on file   Number of children: 1   Years of education: Not on file   Highest education level: Some college, no degree  Occupational History   Occupation: Retired  Tobacco Use   Smoking status: Former    Current packs/day: 0.00    Average packs/day: 1 pack/day for 30.0 years (30.0 ttl pk-yrs)    Types: Cigarettes    Start date: 67    Quit date: 2016    Years since  quitting: 8.9   Smokeless tobacco: Never   Tobacco comments:    Quit smoking 12/2014  Vaping Use   Vaping status: Never Used  Substance and Sexual Activity   Alcohol use: Yes    Comment: 1 mixed drink a day and 2 beers.   Drug use: No   Sexual activity: Not Currently  Other Topics Concern   Not on file  Social History Narrative   Not on file   Social Determinants of Health   Financial Resource Strain: Low Risk  (10/09/2022)   Overall Financial Resource Strain (CARDIA)    Difficulty of Paying Living Expenses: Not hard at all  Food Insecurity: No Food Insecurity (10/15/2022)   Hunger Vital Sign    Worried About Running Out of Food in the Last Year: Never true    Ran Out of Food in the Last Year: Never true  Transportation Needs: No Transportation Needs (10/15/2022)   PRAPARE - Administrator, Civil Service (Medical): No     Lack of Transportation (Non-Medical): No  Physical Activity: Inactive (10/09/2022)   Exercise Vital Sign    Days of Exercise per Week: 0 days    Minutes of Exercise per Session: 0 min  Stress: No Stress Concern Present (10/09/2022)   [employer] of Occupational Health - Occupational Stress Questionnaire    Feeling of Stress : Not at all  Social Connections: Socially Isolated (10/15/2022)   Social Connection and Isolation Panel [NHANES]    Frequency of Communication with Friends and Family: Once a week    Frequency of Social Gatherings with Friends and Family: Never    Attends Religious Services: Never    Database administrator or Organizations: No    Attends Banker Meetings: Never    Marital Status: Never married    Vitals:   05/27/23 1325  BP: 120/64  Pulse: 88  Resp: 12  SpO2: 99%   Body mass index is 21.14 kg/m.  Physical Exam Nursing note reviewed.  Constitutional:      General: He is not in acute distress.    Appearance: He is well-developed.  HENT:     Head: Normocephalic and atraumatic.     Mouth/Throat:     Mouth: Mucous membranes are moist.     Pharynx: Oropharynx is clear. Uvula midline.  Eyes:     Conjunctiva/sclera: Conjunctivae normal.  Cardiovascular:     Rate and Rhythm: Normal rate and regular rhythm. Occasional Extrasystoles are present.    Pulses:          Posterior tibial pulses are 2+ on the left side.     Heart sounds: No murmur heard.    Comments: *** Pulmonary:     Effort: Pulmonary effort is normal. No respiratory distress.     Breath sounds: Normal breath sounds.  Abdominal:     Palpations: Abdomen is soft. There is no hepatomegaly or mass.     Tenderness: There is no abdominal tenderness.  Lymphadenopathy:     Cervical: No cervical adenopathy.  Skin:    General: Skin is warm.     Findings: No erythema or rash.  Neurological:     Mental Status: He is alert and oriented to person, place, and time.     Cranial Nerves:  No cranial nerve deficit.     Gait: Gait normal.  Psychiatric:     Comments: Well groomed, good eye contact.     ASSESSMENT AND PLAN:  Alexander Hunter was seen today for  chronic follow up.   No orders of the defined types were placed in this encounter.   No problem-specific Assessment & Plan notes found for this encounter.   Return in about 5 months (around 10/25/2023) for chronic problems with PCP. Fasting labs in 06/2023.Trula Ore, acting as a scribe for Roza Creamer [place], MD., have documented all relevant documentation on the behalf of Abrar Bilton [place], MD, as directed by  Adden Strout [place], MD while in the presence of Chaseton Yepiz [place], MD.   I, Anthonette Lesage [place], MD, have reviewed all documentation for this visit. The documentation on 05/27/23 for the exam, diagnosis, procedures, and orders are all accurate and complete.  Anay Walter G. [place], MD  [facility]. [place] office.

## 2023-06-10 ENCOUNTER — Ambulatory Visit (HOSPITAL_BASED_OUTPATIENT_CLINIC_OR_DEPARTMENT_OTHER): Payer: HMO | Admitting: Cardiology

## 2023-06-10 ENCOUNTER — Encounter (HOSPITAL_BASED_OUTPATIENT_CLINIC_OR_DEPARTMENT_OTHER): Payer: Self-pay | Admitting: Cardiology

## 2023-06-10 VITALS — BP 120/82 | HR 75 | Ht 67.0 in | Wt 131.6 lb

## 2023-06-10 DIAGNOSIS — I255 Ischemic cardiomyopathy: Secondary | ICD-10-CM

## 2023-06-10 DIAGNOSIS — I2510 Atherosclerotic heart disease of native coronary artery without angina pectoris: Secondary | ICD-10-CM | POA: Diagnosis not present

## 2023-06-10 DIAGNOSIS — E7800 Pure hypercholesterolemia, unspecified: Secondary | ICD-10-CM

## 2023-06-10 DIAGNOSIS — I739 Peripheral vascular disease, unspecified: Secondary | ICD-10-CM

## 2023-06-10 DIAGNOSIS — Z951 Presence of aortocoronary bypass graft: Secondary | ICD-10-CM

## 2023-06-10 DIAGNOSIS — Z7189 Other specified counseling: Secondary | ICD-10-CM

## 2023-06-10 NOTE — Patient Instructions (Signed)

## 2023-06-10 NOTE — Progress Notes (Signed)
Cardiology Office Note:  .   Date:  06/10/2023  ID:  Alexander Hunter, DOB [birth year], MRN [id number] PCP: Alveria Apley, NP  [facility] Providers Cardiologist:  Jodelle Red, MD {  History of Present Illness: .   Alexander Hunter is a 72 y.o. male with a hx of CABG 2016 ([facility]), ischemic cardiomyopathy, PAD. I met him 11/30/22.  Pertinent CV history: ischemic heart disease status post bypass surgery June 2016 (LIMA to the LAD, SVG to ramus, SVG to obtuse marginal, SVG to PDA). Prior EF 45-50%. Abnormal ABIs without symptoms.  Today: Reviewed his lipids from 02/2023. LDL 129. Was taking atorvastatin irregularly at that time, but since the bloodwork he has returned to taking it daily. Has repeat labs pending. HDL is excellent at 106, TG 68.   Saw Dr. [place], she reviewed ABI which was was abnormal. No claudication, rest pain, or nonhealing wounds.   Has chronic arthritis, likes to walk but leg pain limits him. This is his arthritis, present at baseline without exertion. Treats with tylenol and voltaren gel.  No central or left sided chest pain. Sometimes gets burning on the right side of his chest, not clearly related to food. Not exertional. Very brief, self limited, better when he drinks water.  ROS: Denies shortness of breath at rest or with normal exertion. No PND, orthopnea, LE edema or unexpected weight gain. No syncope or palpitations. ROS otherwise negative except as noted.   Studies Reviewed: Marland Kitchen    EKG:       Physical Exam:   VS:  BP 120/82   Pulse 75   Ht 5\' 7"  (1.702 m)   Wt 131 lb 9.6 oz (59.7 kg)   SpO2 99%   BMI 20.61 kg/m    Wt Readings from Last 3 Encounters:  06/10/23 131 lb 9.6 oz (59.7 kg)  05/27/23 135 lb (61.2 kg)  05/02/23 130 lb 12.8 oz (59.3 kg)    GEN: Well nourished, well developed in no acute distress HEENT: Normal, moist mucous membranes NECK: No JVD CARDIAC: regular rhythm, normal S1 and S2, no rubs or gallops. No murmur. VASCULAR:  Radial pulses 2+ bilaterally. Decreased DP pulses bilaterally. No carotid bruits RESPIRATORY:  Clear to auscultation without rales, wheezing or rhonchi  ABDOMEN: Soft, non-tender, non-distended MUSCULOSKELETAL:  Ambulates independently SKIN: Warm and dry, no edema NEUROLOGIC:  Alert and oriented x 3. No focal neuro deficits noted. PSYCHIATRIC:  Normal affect    ASSESSMENT AND PLAN: .    CAD s/p prior CABG 2016 Ischemic cardiomyopathy Hypercholesterolemia PAD without symptoms -last LDL 129, but he was not taking atorvastatin regularly. Has restarted this daily, pending recheck labs. Discussed that if LDL not <70, would add PCSK9i -continue aspirin 81 mg daily -continue atorvastatin 80 mg daily -on no cardiomyopathy meds on initial visit; Last EF 45-50% in 2018. -HR was 60 at initial visit, but has been 75-88 at recent visits. Discussed low dose metoprolol today, he would like to hold off on this for now as he has had some lightheadedness. Continue to evaluate. -tolerating lisinopril 5 mg daily. No BP room to increase. -NYHA class 1  CV risk counseling and prevention -recommend heart healthy/Mediterranean diet, with whole grains, fruits, vegetable, fish, lean meats, nuts, and olive oil. Limit salt. -recommend moderate walking, 3-5 times/week for 30-50 minutes each session. Aim for at least 150 minutes.week. Goal should be pace of 3 miles/hours, or walking 1.5 miles in 30 minutes -recommend avoidance of tobacco products. Avoid excess alcohol.  Dispo: 6 mos or sooner as needed  Signed, Jodelle Red, MD   Jodelle Red, MD, PhD, [facility]  [facility]  [place]  Heart & Vascular at [facility] at [facility] [address] [place], [place] [address] [phone]

## 2023-06-27 ENCOUNTER — Other Ambulatory Visit (INDEPENDENT_AMBULATORY_CARE_PROVIDER_SITE_OTHER): Payer: HMO

## 2023-06-27 DIAGNOSIS — I739 Peripheral vascular disease, unspecified: Secondary | ICD-10-CM

## 2023-06-27 DIAGNOSIS — E785 Hyperlipidemia, unspecified: Secondary | ICD-10-CM

## 2023-06-27 DIAGNOSIS — R944 Abnormal results of kidney function studies: Secondary | ICD-10-CM | POA: Diagnosis not present

## 2023-06-27 DIAGNOSIS — I25119 Atherosclerotic heart disease of native coronary artery with unspecified angina pectoris: Secondary | ICD-10-CM

## 2023-06-27 LAB — LIPID PANEL
Cholesterol: 214 mg/dL — ABNORMAL HIGH (ref 0–200)
HDL: 80.6 mg/dL (ref >39.00)
LDL Cholesterol: 122 mg/dL — ABNORMAL HIGH (ref 0–99)
NonHDL: 133.05
Total CHOL/HDL Ratio: 3
Triglycerides: 57 mg/dL (ref 0.0–149.0)
VLDL: 11.4 mg/dL (ref 0.0–40.0)

## 2023-06-27 LAB — BASIC METABOLIC PANEL
BUN: 22 mg/dL (ref 6–23)
CO2: 26 meq/L (ref 19–32)
Calcium: 9.9 mg/dL (ref 8.4–10.5)
Chloride: 103 meq/L (ref 96–112)
Creatinine, Ser: 1.17 mg/dL (ref 0.40–1.50)
GFR: 63.22 mL/min (ref >60.00)
Glucose, Bld: 106 mg/dL — ABNORMAL HIGH (ref 70–99)
Potassium: 4.4 meq/L (ref 3.5–5.1)
Sodium: 136 meq/L (ref 135–145)

## 2023-06-27 LAB — MICROALBUMIN / CREATININE URINE RATIO
Creatinine,U: 166.5 mg/dL
Microalb Creat Ratio: 0.7 mg/g (ref 0.0–30.0)
Microalb, Ur: 1.2 mg/dL (ref 0.0–1.9)

## 2023-07-03 ENCOUNTER — Other Ambulatory Visit: Payer: Self-pay

## 2023-07-03 DIAGNOSIS — G8929 Other chronic pain: Secondary | ICD-10-CM

## 2023-07-03 MED ORDER — DICLOFENAC SODIUM 1 % EX GEL: 2.0000 g | Freq: Four times a day (QID) | CUTANEOUS | 2 refills | Status: AC

## 2023-07-03 MED ORDER — EZETIMIBE 10 MG PO TABS: 10.0000 mg | ORAL_TABLET | Freq: Every day | ORAL | 3 refills | Status: DC

## 2023-10-14 ENCOUNTER — Other Ambulatory Visit: Payer: Self-pay | Admitting: Family Medicine

## 2023-10-17 ENCOUNTER — Encounter: Payer: Self-pay | Admitting: *Deleted

## 2023-10-24 ENCOUNTER — Other Ambulatory Visit (HOSPITAL_BASED_OUTPATIENT_CLINIC_OR_DEPARTMENT_OTHER)

## 2023-10-24 ENCOUNTER — Ambulatory Visit (HOSPITAL_BASED_OUTPATIENT_CLINIC_OR_DEPARTMENT_OTHER): Admitting: Cardiology

## 2023-10-24 VITALS — BP 110/58 | HR 97 | Ht 67.0 in | Wt 130.4 lb

## 2023-10-24 DIAGNOSIS — Z951 Presence of aortocoronary bypass graft: Secondary | ICD-10-CM | POA: Diagnosis not present

## 2023-10-24 DIAGNOSIS — I493 Ventricular premature depolarization: Secondary | ICD-10-CM

## 2023-10-24 DIAGNOSIS — I2510 Atherosclerotic heart disease of native coronary artery without angina pectoris: Secondary | ICD-10-CM

## 2023-10-24 DIAGNOSIS — E7800 Pure hypercholesterolemia, unspecified: Secondary | ICD-10-CM

## 2023-10-24 DIAGNOSIS — I739 Peripheral vascular disease, unspecified: Secondary | ICD-10-CM | POA: Diagnosis not present

## 2023-10-24 DIAGNOSIS — I255 Ischemic cardiomyopathy: Secondary | ICD-10-CM | POA: Diagnosis not present

## 2023-10-24 NOTE — Progress Notes (Signed)
 Cardiology Office Note:  .   Date:  10/24/2023  ID:  Alexander Hunter, DOB [birth year], MRN [id number] PCP: Francenia Ingle, NP  [facility] Providers Cardiologist:  Sheryle Donning, MD {  History of Present Illness: .   Alexander Hunter is a 73 y.o. male with a hx of CABG 2016 ([facility]), ischemic cardiomyopathy, PAD. I met him 11/30/22.  Pertinent CV history: ischemic heart disease status post bypass surgery June 2016 (LIMA to the LAD, SVG to ramus, SVG to obtuse marginal, SVG to PDA). Prior EF 45-50%. Abnormal ABIs without symptoms. Repeat ABI 04/2023 with noncompressible study on the right, moderate PAD on the left. Ordered for dopplers but not completed.   Today: Struggles with mobility from arthritis and prior ankle injury. Trying to avoid surgery/ankle replacement. Discussed joint/arthritis pain vs. Claudication. No wounds, very focal/localized pain, not related to time of exertion. No calf/thigh claudication. Does improve with OTC meds, soaking in epsom salt. Discussed recommendations for arterial dopplers based on prior ABIs.  Noted frequent PVCs on ECG today. Denies any symptoms. Reviewed workup today.  ROS: Denies shortness of breath at rest or with normal exertion. No PND, orthopnea, LE edema or unexpected weight gain. No syncope or palpitations. ROS otherwise negative except as noted.   Studies Reviewed: Aaron Aas    EKG:  EKG Interpretation Date/Time:  Thursday Oct 24 2023 13:43:21 EDT Ventricular Rate:  97 PR Interval:  114 QRS Duration:  104 QT Interval:  362 QTC Calculation: 459 R Axis:   -13  Text Interpretation: Sinus rhythm with frequent Premature ventricular complexes in a pattern of bigeminy Possible Left atrial enlargement Incomplete right bundle branch block Inferior infarct , age undetermined Confirmed by Sheryle Donning [phone]) on 10/24/2023 2:04:27 PM    Physical Exam:   VS:  BP (!) 110/58   Pulse 97   Ht 5\' 7"  (1.702 m)   Wt 130 lb 6.4 oz (59.1 kg)    SpO2 99%   BMI 20.42 kg/m    Wt Readings from Last 3 Encounters:  10/24/23 130 lb 6.4 oz (59.1 kg)  06/10/23 131 lb 9.6 oz (59.7 kg)  05/27/23 135 lb (61.2 kg)    GEN: Well nourished, well developed in no acute distress HEENT: Normal, moist mucous membranes NECK: No JVD CARDIAC: regular rhythm with rare ectopy, normal S1 and S2, no rubs or gallops. No murmur. VASCULAR: Radial pulses 2+ bilaterally. Decreased DP pulses bilaterally. No carotid bruits RESPIRATORY:  Clear to auscultation without rales, wheezing or rhonchi  ABDOMEN: Soft, non-tender, non-distended MUSCULOSKELETAL:  Ambulates independently SKIN: Warm and dry, no edema NEUROLOGIC:  Alert and oriented x 3. No focal neuro deficits noted. PSYCHIATRIC:  Normal affect    ASSESSMENT AND PLAN: .    CAD s/p prior CABG 2016 Ischemic cardiomyopathy Hypercholesterolemia PAD without symptoms -reviewed recent lipids, LDL 95, not at goal. We have discussed PCSK9i -continue aspirin  81 mg daily -continue atorvastatin  80 mg daily, ezetimibe  10 mg daily -on no cardiomyopathy meds on initial visit; Last EF 45-50% in 2018. -HR has been bradycardic in the past, we have discussed metoprolol but avoided due to this. See below given PVCs today -tolerating lisinopril  5 mg daily. No BP room to increase. -NYHA class 1 -reviewed his ABIs, recommendations for arterial dopplers (these are ordered but he didn't scheduled)--he will schedule these  -reviewed red flag warning signs that need immediate medical attention  PVCs -noted on ECG today. Asymptomatic. Discussed monitor to determine burden, echo to evaluate for changes in EF  if burden. -discussed management, including metoprolol. Reviewed concerns for bradycardia. Reviewed how monitor would help us  determine his HR variability and options for management -will start with monitor for evaluation  CV risk counseling and prevention -recommend heart healthy/Mediterranean diet, with whole grains,  fruits, vegetable, fish, lean meats, nuts, and olive oil. Limit salt. -recommend moderate walking, 3-5 times/week for 30-50 minutes each session. Aim for at least 150 minutes.week. Goal should be pace of 3 miles/hours, or walking 1.5 miles in 30 minutes -recommend avoidance of tobacco products. Avoid excess alcohol.  Dispo: 2 mos or sooner as needed to review test results  Signed, Sheryle Donning, MD   Sheryle Donning, MD, PhD, [facility]  [facility]  [place]  Heart & Vascular at [facility] [place] at [facility] [address] [place], [place] [address] [phone]

## 2023-10-24 NOTE — Patient Instructions (Addendum)
 Medication Instructions:  Your physician recommends that you continue on your current medications as directed. Please refer to the Current Medication list given to you today.   *If you need a refill on your cardiac medications before your next appointment, please call your pharmacy*  Testing/Procedures: Your physician has recommended that you wear a Zio monitor.   This monitor is a medical device that records the heart's electrical activity. Doctors most often use these monitors to diagnose arrhythmias. Arrhythmias are problems with the speed or rhythm of the heartbeat. The monitor is a small device applied to your chest. You can wear one while you do your normal daily activities. While wearing this monitor if you have any symptoms to push the button and record what you felt. Once you have worn this monitor for the period of time provider prescribed (Usually 14 days), you will return the monitor device in the postage paid box. Once it is returned they will download the data collected and provide us  with a report which the provider will then review and we will call you with those results. Important tips:  Avoid showering during the first 24 hours of wearing the monitor. Avoid excessive sweating to help maximize wear time. Do not submerge the device, no hot tubs, and no swimming pools. Keep any lotions or oils away from the patch. After 24 hours you may shower with the patch on. Take brief showers with your back facing the shower head.  Do not remove patch once it has been placed because that will interrupt data and decrease adhesive wear time. Push the button when you have any symptoms and write down what you were feeling. Once you have completed wearing your monitor, remove and place into box which has postage paid and place in your outgoing mailbox.  If for some reason you have misplaced your box then call our office and we can provide another box and/or mail it off for you.   Follow-Up: At  [facility], you and your health needs are our priority.  As part of our continuing mission to provide you with exceptional heart care, our providers are all part of one team.  This team includes your primary Cardiologist (physician) and Advanced Practice Providers or APPs (Physician Assistants and Nurse Practitioners) who all work together to provide you with the care you need, when you need it.  Please follow up in 2 months with Dr. Veryl Gottron, Slater Duncan, NP or Neomi Banks, NP   We recommend signing up for the patient portal called "MyChart".  Sign up information is provided on this After Visit Summary.  MyChart is used to connect with patients for Virtual Visits (Telemedicine).  Patients are able to view lab/test results, encounter notes, upcoming appointments, etc.  Non-urgent messages can be sent to your provider as well.   To learn more about what you can do with MyChart, go to [email or web address].

## 2023-10-25 ENCOUNTER — Encounter: Payer: Self-pay | Admitting: Family Medicine

## 2023-10-25 ENCOUNTER — Ambulatory Visit (INDEPENDENT_AMBULATORY_CARE_PROVIDER_SITE_OTHER): Payer: HMO | Admitting: Family Medicine

## 2023-10-25 VITALS — BP 102/60 | Temp 98.1°F | Ht 67.0 in | Wt 128.0 lb

## 2023-10-25 DIAGNOSIS — M25511 Pain in right shoulder: Secondary | ICD-10-CM

## 2023-10-25 DIAGNOSIS — M25572 Pain in left ankle and joints of left foot: Secondary | ICD-10-CM

## 2023-10-25 DIAGNOSIS — E7800 Pure hypercholesterolemia, unspecified: Secondary | ICD-10-CM | POA: Diagnosis not present

## 2023-10-25 DIAGNOSIS — G8929 Other chronic pain: Secondary | ICD-10-CM | POA: Diagnosis not present

## 2023-10-25 DIAGNOSIS — E785 Hyperlipidemia, unspecified: Secondary | ICD-10-CM | POA: Diagnosis not present

## 2023-10-25 LAB — LIPID PANEL
Cholesterol: 186 mg/dL (ref 0–200)
HDL: 82.6 mg/dL (ref >39.00)
LDL Cholesterol: 95 mg/dL (ref 0–99)
NonHDL: 103.83
Total CHOL/HDL Ratio: 2
Triglycerides: 43 mg/dL (ref 0.0–149.0)
VLDL: 8.6 mg/dL (ref 0.0–40.0)

## 2023-10-25 LAB — COMPREHENSIVE METABOLIC PANEL WITH GFR
ALT: 41 U/L (ref 0–53)
AST: 37 U/L (ref 0–37)
Albumin: 4.3 g/dL (ref 3.5–5.2)
Alkaline Phosphatase: 46 U/L (ref 39–117)
BUN: 22 mg/dL (ref 6–23)
CO2: 26 meq/L (ref 19–32)
Calcium: 10 mg/dL (ref 8.4–10.5)
Chloride: 106 meq/L (ref 96–112)
Creatinine, Ser: 1.31 mg/dL (ref 0.40–1.50)
GFR: 55.08 mL/min — ABNORMAL LOW (ref >60.00)
Glucose, Bld: 95 mg/dL (ref 70–99)
Potassium: 4.8 meq/L (ref 3.5–5.1)
Sodium: 137 meq/L (ref 135–145)
Total Bilirubin: 0.5 mg/dL (ref 0.2–1.2)
Total Protein: 7.6 g/dL (ref 6.0–8.3)

## 2023-10-25 NOTE — Patient Instructions (Addendum)
-  Placed a referral to ortho for left ankle pain. Continue to take Tylenol  and Diclofenac  gel for pain. Please call the office or send a MyChart message in 2 weeks if you do not receive a phone call or MyChart message about scheduling appointments.  -Placed a referral to physical therapy for right shoulder pain.  -Ordered labs.  -Continue all medications.  -Follow up in 6 months for a physical.
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
## 2023-10-31 ENCOUNTER — Ambulatory Visit: Admitting: Orthopedic Surgery

## 2023-10-31 ENCOUNTER — Encounter (HOSPITAL_BASED_OUTPATIENT_CLINIC_OR_DEPARTMENT_OTHER): Payer: Self-pay | Admitting: Cardiology

## 2023-10-31 ENCOUNTER — Encounter: Payer: Self-pay | Admitting: Orthopedic Surgery

## 2023-10-31 ENCOUNTER — Other Ambulatory Visit (INDEPENDENT_AMBULATORY_CARE_PROVIDER_SITE_OTHER): Payer: Self-pay

## 2023-10-31 DIAGNOSIS — M19172 Post-traumatic osteoarthritis, left ankle and foot: Secondary | ICD-10-CM

## 2023-10-31 DIAGNOSIS — M25572 Pain in left ankle and joints of left foot: Secondary | ICD-10-CM | POA: Diagnosis not present
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
## 2023-11-01 ENCOUNTER — Ambulatory Visit (INDEPENDENT_AMBULATORY_CARE_PROVIDER_SITE_OTHER): Payer: HMO

## 2023-11-01 VITALS — BP 118/60 | HR 60 | Temp 98.7°F | Ht 67.0 in | Wt 137.5 lb

## 2023-11-01 DIAGNOSIS — Z0000 Encounter for general adult medical examination without abnormal findings: Secondary | ICD-10-CM

## 2023-11-01 NOTE — Patient Instructions (Addendum)
 Alexander Hunter , Thank you for taking time out of your busy schedule to complete your Annual Wellness Visit with me. I enjoyed our conversation and look forward to speaking with you again next year. I, as well as your care team,  appreciate your ongoing commitment to your health goals. Please review the following plan we discussed and let me know if I can assist you in the future. Your Game plan/ To Do List    Referrals: If you haven't heard from the office you've been referred to, please reach out to them at the phone provided.   Follow up Visits: Next Medicare AWV with our clinical staff: 11/06/24   Have you seen your provider in the last 6 months (3 months if uncontrolled diabetes)? No Next Office Visit with your provider: 04/27/24  Clinician Recommendations:  Aim for 30 minutes of exercise or brisk walking, 6-8 glasses of water, and 5 servings of fruits and vegetables each day.       This is a list of the screening recommended for you and due dates:  Health Maintenance  Topic Date Due   Screening for Lung Cancer  Never done   Zoster (Shingles) Vaccine (1 of 2) Never done   DTaP/Tdap/Td vaccine (2 - Td or Tdap) 06/17/2019   COVID-19 Vaccine (9 - 2024-25 season) 02/24/2023   Flu Shot  01/24/2024   Pneumonia Vaccine (3 of 3 - PCV20 or PCV21) 05/12/2024   Medicare Annual Wellness Visit  10/31/2024   Colon Cancer Screening  08/22/2026   Hepatitis C Screening  Completed   HPV Vaccine  Aged Out   Meningitis B Vaccine  Aged Out    Advanced directives: (Copy Requested) Please bring a copy of your health care power of attorney and living will to the office to be added to your chart at your convenience. You can mail to [facility] [address] [place], [place] [address] or email to [email or web address] Advance Care Planning is important because it:  [x]  Makes sure you receive the medical care that is consistent with your values, goals, and preferences  [x]  It provides  guidance to your family and loved ones and reduces their decisional burden about whether or not they are making the right decisions based on your wishes.  Follow the link provided in your after visit summary or read over the paperwork we have mailed to you to help you started getting your Advance Directives in place. If you need assistance in completing these, please reach out to us  so that we can help you!  See attachments for Preventive Care and Fall Prevention Tips.

## 2023-11-01 NOTE — Progress Notes (Addendum)
 Subjective:   Alexander Hunter is a 73 y.o. who presents for a Medicare Wellness preventive visit.  As a reminder, Annual Wellness Visits don't include a physical exam, and some assessments may be limited, especially if this visit is performed virtually. We may recommend an in-person visit if needed.  Visit Complete: In person    Persons Participating in Visit: Patient.  AWV Questionnaire: No: Patient Medicare AWV questionnaire was not completed prior to this visit.  Cardiac Risk Factors include: advanced age (>47men, >11 women);male gender     Objective:     Today's Vitals   11/01/23 1118  BP: 118/60  Pulse: 60  Temp: 98.7 F (37.1 C)  TempSrc: Oral  SpO2: 99%  Weight: 137 lb 8 oz (62.4 kg)  Height: 5\' 7"  (1.702 m)   Body mass index is 21.54 kg/m.     11/01/2023   11:34 AM 10/25/2022    2:15 PM 10/15/2022   10:30 AM 10/15/2022    9:40 AM 08/15/2020    9:40 AM 06/01/2015    6:41 AM 05/26/2015    8:32 AM  Advanced Directives  Does Patient Have a Medical Advance Directive? Yes Yes Yes No No No No  Type of Estate agent of [place];Living will Healthcare Power of [place];Living will Healthcare Power of [place];Living will      Does patient want to make changes to medical advance directive?  No - Patient declined       Copy of Healthcare Power of Attorney in Chart? No - copy requested No - copy requested No - copy requested      Would patient like information on creating a medical advance directive?  No - Patient declined   No - Patient declined No - patient declined information Yes - Educational materials given    Current Medications (verified) Outpatient Encounter Medications as of 11/01/2023  Medication Sig   aspirin  EC 81 MG tablet Take 1 tablet (81 mg total) by mouth daily. Swallow whole.   atorvastatin  (LIPITOR) 80 MG tablet Take 1 tablet by mouth each evening.   diclofenac  Sodium (VOLTAREN ) 1 % GEL Apply 2 g topically 4 (four) times daily.    ezetimibe  (ZETIA ) 10 MG tablet Take 1 tablet by mouth once daily   lisinopril  (ZESTRIL ) 5 MG tablet Take 1 tablet (5 mg total) by mouth daily.   Multiple Vitamin (ONE-A-DAY MENS PO) Take by mouth. Per patient gel caps   No facility-administered encounter medications on file as of 11/01/2023.    Allergies (verified) Patient has no known allergies.   History: Past Medical History:  Diagnosis Date   Back pain    states has been like this for yrs   Coronary artery disease    History of blood transfusion 11/2014   no abnormal reaction noted   HLD (hyperlipidemia)    takes Atorvastatin  daily   Insomnia    doesn't take any meds   Myocardial infarction (HCC) 11/2014   Nocturia    Pneumonia    as a child   Shortness of breath dyspnea    rarely but notices with exertion.States doesn't happen during cardiac reheab   Past Surgical History:  Procedure Laterality Date   CARDIAC CATHETERIZATION  12-16-14   CORONARY ARTERY BYPASS GRAFT  11/2014   x 4-done at [facility] REPAIR     as a child   INGUINAL HERNIA REPAIR Bilateral 06/01/2015   Procedure: OPEN  BILATERAL HERNIA REPAIR;  Surgeon: Derral Flick, MD;  Location: [facility] OR;  Service: General;  Laterality: Bilateral;   INSERTION OF MESH Bilateral 06/01/2015   Procedure: INSERTION OF MESH;  Surgeon: Alphonso Aschoff Kinsinger, MD;  Location: [facility] OR;  Service: General;  Laterality: Bilateral;   TONSILLECTOMY AND ADENOIDECTOMY     as a child   Family History  Problem Relation Age of Onset   Heart attack Mother    Heart disease Mother        Heart attack   Heart attack Father    Heart disease Father        Heart attack   Heart failure Brother    COPD Brother    Asthma Son        As a child   Heart attack Maternal Grandmother    Heart disease Maternal Grandmother        Heart attack   Heart attack Maternal Grandfather    Heart disease Maternal Grandfather        Heart attack   Social History   Socioeconomic History    Marital status: Single    Spouse name: Not on file   Number of children: 1   Years of education: Not on file   Highest education level: Some college, no degree  Occupational History   Occupation: Retired  Tobacco Use   Smoking status: Former    Current packs/day: 0.00    Average packs/day: 1 pack/day for 30.0 years (30.0 ttl pk-yrs)    Types: Cigarettes    Start date: 85    Quit date: 2016    Years since quitting: 9.3   Smokeless tobacco: Never   Tobacco comments:    Quit smoking 12/2014  Vaping Use   Vaping status: Never Used  Substance and Sexual Activity   Alcohol use: Yes    Comment: 1 mixed drink a day and 2 beers.   Drug use: No   Sexual activity: Not Currently  Other Topics Concern   Not on file  Social History Narrative   Not on file   Social Drivers of Health   Financial Resource Strain: Low Risk  (11/01/2023)   Overall Financial Resource Strain (CARDIA)    Difficulty of Paying Living Expenses: Not hard at all  Food Insecurity: No Food Insecurity (11/01/2023)   Hunger Vital Sign    Worried About Running Out of Food in the Last Year: Never true    Ran Out of Food in the Last Year: Never true  Transportation Needs: No Transportation Needs (11/01/2023)   PRAPARE - Administrator, Civil Service (Medical): No    Lack of Transportation (Non-Medical): No  Physical Activity: Insufficiently Active (11/01/2023)   Exercise Vital Sign    Days of Exercise per Week: 3 days    Minutes of Exercise per Session: 10 min  Stress: No Stress Concern Present (11/01/2023)   [employer] of Occupational Health - Occupational Stress Questionnaire    Feeling of Stress : Not at all  Social Connections: Socially Isolated (11/01/2023)   Social Connection and Isolation Panel [NHANES]    Frequency of Communication with Friends and Family: More than three times a week    Frequency of Social Gatherings with Friends and Family: More than three times a week    Attends Religious  Services: Never    Database administrator or Organizations: No    Attends Banker Meetings: Never    Marital Status: Divorced    Tobacco Counseling Counseling given: Not Answered Tobacco comments: Quit  smoking 12/2014    Clinical Intake:  Pre-visit preparation completed: Yes  Pain : No/denies pain     BMI - recorded: 21.54 Nutritional Status: BMI of 19-24  Normal Nutritional Risks: None Diabetes: No  Lab Results  Component Value Date   HGBA1C 6.0 (H) 08/02/2020   HGBA1C 5.5 05/13/2019     How often do you need to have someone help you when you read instructions, pamphlets, or other written materials from your doctor or pharmacy?: 1 - Never  Interpreter Needed?: No  Information entered by :: Farris Hong LPN   Activities of Daily Living     11/01/2023   11:33 AM  In your present state of health, do you have any difficulty performing the following activities:  Hearing? 0  Vision? 0  Difficulty concentrating or making decisions? 0  Walking or climbing stairs? 0  Dressing or bathing? 0  Doing errands, shopping? 0  Preparing Food and eating ? N  Using the Toilet? N  In the past six months, have you accidently leaked urine? N  Do you have problems with loss of bowel control? N  Managing your Medications? N  Managing your Finances? N  Housekeeping or managing your Housekeeping? N    Patient Care Team: Francenia Ingle, NP as PCP - General (Family Medicine) Sheryle Donning, MD as PCP - Cardiology (Cardiology)  Indicate any recent Medical Services you may have received from other than [facility] providers in the past year (date may be approximate).     Assessment:    This is a routine wellness examination for [facility].  Hearing/Vision screen Hearing Screening - Comments:: Denies hearing difficulties   Vision Screening - Comments:: Wears rx glasses - up to date with routine eye exams with  [facility]   Goals Addressed                This Visit's Progress     Increase physical activity (pt-stated)         Depression Screen     11/01/2023   11:19 AM 04/11/2023    9:33 AM 10/15/2022    8:43 AM 10/09/2022    7:46 AM 09/24/2022    2:02 PM 03/06/2021   11:53 AM 01/04/2021   11:18 AM  PHQ 2/9 Scores  PHQ - 2 Score 0 0 0 0 3 0 0  PHQ- 9 Score  2 0 0 3      Fall Risk     11/01/2023   11:33 AM 04/11/2023    9:32 AM 10/15/2022    8:43 AM 10/09/2022    7:45 AM 09/24/2022    2:02 PM  Fall Risk   Falls in the past year? 0 0 0 0 0  Number falls in past yr: 0 0 0 0 0  Injury with Fall? 0 0 0 0 0  Risk for fall due to : No Fall Risks No Fall Risks No Fall Risks No Fall Risks No Fall Risks  Follow up Falls prevention discussed;Falls evaluation completed Falls evaluation completed  Falls evaluation completed Falls evaluation completed    MEDICARE RISK AT HOME:  Medicare Risk at Home Any stairs in or around the home?: Yes If so, are there any without handrails?: No Home free of loose throw rugs in walkways, pet beds, electrical cords, etc?: Yes Adequate lighting in your home to reduce risk of falls?: Yes Life alert?: No Use of a cane, walker or w/c?: No Grab bars in the bathroom?: No Shower chair  or bench in shower?: No Elevated toilet seat or a handicapped toilet?: No  TIMED UP AND GO:  Was the test performed?  Yes  Length of time to ambulate 10 feet: 10 sec Gait steady and fast without use of assistive device  Cognitive Function: 6CIT completed    10/15/2022   10:33 AM  MMSE - Mini Mental State Exam  Orientation to time 5  Orientation to Place 5  Registration 3  Attention/ Calculation 5  Recall 3  Language- name 2 objects 2  Language- repeat 1  Language- follow 3 step command 3  Language- read & follow direction 1  Write a sentence 1  Copy design 1  Total score 30        11/01/2023   11:34 AM 10/15/2022   10:00 AM  6CIT Screen  What Year? 0 points 0 points  What month? 0 points 0 points  What time?  0 points 0 points  Count back from 20 0 points   Months in reverse 0 points   Repeat phrase 2 points   Total Score 2 points     Immunizations Immunization History  Administered Date(s) Administered   PFIZER(Purple Top)SARS-COV-2 Vaccination 11/17/2019, 12/08/2019, 06/14/2020   PPD Test 12/30/2014   Pfizer Covid-19 Vaccine Bivalent Booster 52yrs & up 11/17/2019, 12/08/2019, 06/21/2020, 11/24/2020, 04/12/2021   Pneumococcal Conjugate-13 05/13/2019   Pneumococcal Polysaccharide-23 06/02/2015   Tdap 06/16/2009    Screening Tests Health Maintenance  Topic Date Due   Lung Cancer Screening  Never done   Zoster Vaccines- Shingrix (1 of 2) Never done   DTaP/Tdap/Td (2 - Td or Tdap) 06/17/2019   COVID-19 Vaccine (9 - 2024-25 season) 02/24/2023   INFLUENZA VACCINE  01/24/2024   Pneumonia Vaccine 38+ Years old (3 of 3 - PCV20 or PCV21) 05/12/2024   Medicare Annual Wellness (AWV)  10/31/2024   Colonoscopy  08/22/2026   Hepatitis C Screening  Completed   HPV VACCINES  Aged Out   Meningococcal B Vaccine  Aged Out    Health Maintenance  Health Maintenance Due  Topic Date Due   Lung Cancer Screening  Never done   Zoster Vaccines- Shingrix (1 of 2) Never done   DTaP/Tdap/Td (2 - Td or Tdap) 06/17/2019   COVID-19 Vaccine (9 - 2024-25 season) 02/24/2023   Health Maintenance Items Addressed: Patient declined Lung Cancer Screening Vaccines deferred  Additional Screening:  Vision Screening: Recommended annual ophthalmology exams for early detection of glaucoma and other disorders of the eye.  Dental Screening: Recommended annual dental exams for proper oral hygiene  Community Resource Referral / Chronic Care Management: CRR required this visit?  No   CCM required this visit?  No   Plan:    I have personally reviewed and noted the following in the patient's chart:   Medical and social history Use of alcohol, tobacco or illicit drugs  Current medications and supplements  including opioid prescriptions. Patient is not currently taking opioid prescriptions. Functional ability and status Nutritional status Physical activity Advanced directives List of other physicians Hospitalizations, surgeries, and ER visits in previous 12 months Vitals Screenings to include cognitive, depression, and falls Referrals and appointments  In addition, I have reviewed and discussed with patient certain preventive protocols, quality metrics, and best practice recommendations. A written personalized care plan for preventive services as well as general preventive health recommendations were provided to patient.   Dewayne Ford, LPN   4/0/9811   After Visit Summary: (In Person-Printed) AVS printed and given  to the patient  Notes: Nothing significant to report at this time.

## 2023-11-05 ENCOUNTER — Other Ambulatory Visit: Payer: Self-pay | Admitting: *Deleted

## 2023-11-05 ENCOUNTER — Telehealth: Payer: Self-pay | Admitting: *Deleted

## 2023-11-05 DIAGNOSIS — Z87891 Personal history of nicotine dependence: Secondary | ICD-10-CM

## 2023-11-05 DIAGNOSIS — Z122 Encounter for screening for malignant neoplasm of respiratory organs: Secondary | ICD-10-CM

## 2023-11-05 NOTE — Telephone Encounter (Signed)
 Lung Cancer Screening Narrative/Criteria Questionnaire (Cigarette Smokers Only- No Cigars/Pipes/vapes)   Alexander Hunter   SDMV:11/27/23 8:30- [name]                                           Jan 09, 1951              LDCT: 11/29/23 9:40- GI    73 y.o.   Phone: [phone]  Lung Screening Narrative (confirm age 70-77 yrs Medicare / 50-80 yrs Private pay insurance)   Insurance information:HTA   Referring Provider:[place]   This screening involves an initial phone call with a team member from our program. It is called a shared decision making visit. The initial meeting is required by insurance and Medicare to make sure you understand the program. This appointment takes about 15-20 minutes to complete. The CT scan will completed at a separate date/time. This scan takes about 5-10 minutes to complete and you may eat and drink before and after the scan.  Criteria questions for Lung Cancer Screening:   Are you a current or former smoker? Former Age began smoking: 26   If you are a former smoker, what year did you quit smoking? 2016 (within 15 yrs)   To calculate your smoking history, I need an accurate estimate of how many packs of cigarettes you smoked per day and for how many years. (Not just the number of PPD you are now smoking)   Years smoking 35 x Packs per day 1 = Pack years 37   (at least 20 pack yrs)   (Make sure they understand that we need to know how much they have smoked in the past, not just the number of PPD they are smoking now)  Do you have a personal history of cancer?  No    Do you have a family history of cancer? No  Are you coughing up blood?  No  Have you had unexplained weight loss of 15 lbs or more in the last 6 months? No  It looks like you meet all criteria.     Additional information: N/A

## 2023-11-09 ENCOUNTER — Other Ambulatory Visit: Payer: Self-pay | Admitting: Family Medicine

## 2023-11-12 DIAGNOSIS — I493 Ventricular premature depolarization: Secondary | ICD-10-CM | POA: Diagnosis not present

## 2023-11-14 ENCOUNTER — Ambulatory Visit: Payer: Self-pay

## 2023-11-14 NOTE — Progress Notes (Signed)
 Patient was here for refurbish but realized he has HTA and they will pay for 80% once approved  Patient will hold off on re-furbish is going to make appt with Dr Alvah Auerbach for new orthotic order   Britton Cane Cped, CFo, CFm

## 2023-11-27 ENCOUNTER — Ambulatory Visit: Payer: Self-pay | Admitting: Podiatry

## 2023-11-27 ENCOUNTER — Encounter: Payer: Self-pay | Admitting: Acute Care

## 2023-11-27 ENCOUNTER — Ambulatory Visit: Admitting: Acute Care

## 2023-11-27 DIAGNOSIS — M19072 Primary osteoarthritis, left ankle and foot: Secondary | ICD-10-CM | POA: Diagnosis not present

## 2023-11-27 DIAGNOSIS — Z87891 Personal history of nicotine dependence: Secondary | ICD-10-CM

## 2023-11-27 MED ORDER — METHYLPREDNISOLONE 4 MG PO TBPK: ORAL_TABLET | ORAL | 0 refills | Status: DC

## 2023-11-27 NOTE — Progress Notes (Signed)
  Subjective:  Patient ID: Alexander Hunter, male    DOB: 02/25/1951,   MRN: [id number]  Chief Complaint  Patient presents with   Foot Pain    Rm 21 Patient here for left ankle pain. Previously taking Meloxicam  15mg  once daily with no relief of pain.(Prescribed by Dr. Celia Coles 03/2023). Left ankle X-rayed on 10/31/23 by Dr. Julio Ohm. Patient is interested in custom orthotics.    73 y.o. male presents for follow-up of left ankle arthritis. Relates the pain has been doing about the same. States things come and go.Has seen Dr. Julio Ohm who had discussed surgery with him.  . Denies any other pedal complaints. Denies n/v/f/c.   Past Medical History:  Diagnosis Date   Back pain    states has been like this for yrs   Coronary artery disease    History of blood transfusion 11/2014   no abnormal reaction noted   HLD (hyperlipidemia)    takes Atorvastatin  daily   Insomnia    doesn't take any meds   Myocardial infarction (HCC) 11/2014   Nocturia    Pneumonia    as a child   Shortness of breath dyspnea    rarely but notices with exertion.States doesn't happen during cardiac reheab    Objective:  Physical Exam: Vascular: DP/PT pulses 2/4 bilateral. CFT <3 seconds. Normal hair growth on digits. No edema.  Skin. No lacerations or abrasions bilateral feet.  Musculoskeletal: MMT 5/5 bilateral lower extremities in DF, PF, Inversion and Eversion. Deceased ROM in DF of ankle joint. Mildly tender to anterior ankle joint line. Some pain with ROM of the ankle. No pain with palpation to subtalar joint or with ROM on left.  Neurological: Sensation intact to light touch.   Assessment:   1. Arthritis of ankle, left       Plan:  Patient was evaluated and treated and all questions answered. Discussed ankle arthritis with patient and treatment options.  X-rays reviewed. No acute fractures or dislocations. Sever degenerative changes noted throughout ankle Discussed NSAIDS, topicals, and possible injections.  Medrol  dose pack provided.  Will avoid NSAIDS  [facility] [place]  brace.  Discussed if pain does not improve can discuss surgical options. Discussed he at some point will likely need surgery.  Patient to follow-up as needed.    Jennefer Moats, DPM

## 2023-11-27 NOTE — Progress Notes (Signed)
  Virtual Visit via Telephone Note  I connected with Alexander Hunter on 11/27/23 at  8:30 AM EDT by telephone and verified that I am speaking with the correct person using two identifiers.  Location: Patient:  At home  Provider: [address] [place], [place], [address]    I discussed the limitations, risks, security and privacy concerns of performing an evaluation and management service by telephone and the availability of in person appointments. I also discussed with the patient that there may be a patient responsible charge related to this service. The patient expressed understanding and agreed to proceed.     Shared Decision Making Visit Lung Cancer Screening Program [phone])   Eligibility: Age 73 y.o. Pack Years Smoking History Calculation 35 pack years (# packs/per year x # years smoked) Recent History of coughing up blood  no Unexplained weight loss? no ( >Than 15 pounds within the last 6 months ) Prior History Lung / other cancer no (Diagnosis within the last 5 years already requiring surveillance chest CT Scans). Smoking Status Former Smoker Former Smokers: Years since quit: 9 years  Quit Date: 2016  Visit Components: Discussion included one or more decision making aids. yes Discussion included risk/benefits of screening. yes Discussion included potential follow up diagnostic testing for abnormal scans. yes Discussion included meaning and risk of over diagnosis. yes Discussion included meaning and risk of False Positives. yes Discussion included meaning of total radiation exposure. yes  Counseling Included: Importance of adherence to annual lung cancer LDCT screening. yes Impact of comorbidities on ability to participate in the program. yes Ability and willingness to under diagnostic treatment. yes  Smoking Cessation Counseling: Current Smokers:  Discussed importance of smoking cessation. yes Information about tobacco cessation classes and interventions  provided to patient. yes Patient provided with "ticket" for LDCT Scan. yes Symptomatic Patient. no  Counseling NA Diagnosis Code: Tobacco Use Z72.0 Asymptomatic Patient yes  Counseling (Intermediate counseling: > three minutes counseling) [id number] Former Smokers:  Discussed the importance of maintaining cigarette abstinence. yes Diagnosis Code: Personal History of Nicotine Dependence. X91.478 Information about tobacco cessation classes and interventions provided to patient. Yes Patient provided with "ticket" for LDCT Scan. yes Written Order for Lung Cancer Screening with LDCT placed in Epic. Yes (CT Chest Lung Cancer Screening Low Dose W/O CM) [id number] Z12.2-Screening of respiratory organs Z87.891-Personal history of nicotine dependence   Raejean Bullock, NP

## 2023-11-27 NOTE — Patient Instructions (Signed)

## 2023-11-29 ENCOUNTER — Ambulatory Visit
Admission: RE | Admit: 2023-11-29 | Discharge: 2023-11-29 | Disposition: A | Discharge status: Home / Self Care | Source: Ambulatory Visit | Attending: Acute Care | Admitting: Acute Care

## 2023-11-29 ENCOUNTER — Other Ambulatory Visit: Payer: Self-pay | Admitting: *Deleted

## 2023-11-29 DIAGNOSIS — Z122 Encounter for screening for malignant neoplasm of respiratory organs: Secondary | ICD-10-CM

## 2023-11-29 DIAGNOSIS — I739 Peripheral vascular disease, unspecified: Secondary | ICD-10-CM

## 2023-11-29 DIAGNOSIS — Z87891 Personal history of nicotine dependence: Secondary | ICD-10-CM

## 2023-12-06 ENCOUNTER — Ambulatory Visit (HOSPITAL_BASED_OUTPATIENT_CLINIC_OR_DEPARTMENT_OTHER): Payer: HMO | Admitting: Cardiology

## 2023-12-07 ENCOUNTER — Other Ambulatory Visit: Payer: Self-pay | Admitting: Family Medicine

## 2023-12-08 ENCOUNTER — Ambulatory Visit (HOSPITAL_BASED_OUTPATIENT_CLINIC_OR_DEPARTMENT_OTHER): Payer: Self-pay | Admitting: Cardiology

## 2023-12-08 DIAGNOSIS — I493 Ventricular premature depolarization: Secondary | ICD-10-CM

## 2023-12-09 MED ORDER — METOPROLOL SUCCINATE ER 50 MG PO TB24: 25.0000 mg | ORAL_TABLET | Freq: Every day | ORAL | 3 refills | Status: DC

## 2023-12-09 NOTE — Telephone Encounter (Signed)
-----   Message from Dewight Fore sent at 12/09/2023 11:46 AM EDT -----

## 2023-12-09 NOTE — Telephone Encounter (Signed)
-----   Message from [facility] sent at 12/08/2023  8:06 PM EDT ----- Please call the patient and relay the following:  The monitor showed frequent PVCs. Based on this, I'd recommend getting an echo to determine if these frequent abnormal beats are affecting the pump function of the heart. We typically use metoprolol  to help treat these beats. He does have occasional slower heart rates, but his average heart rate was 90 bpm, suggesting he may tolerate a low dose of metoprolol. I'd start metoprolol succinate 25 mg  daily and bring him back for a follow up visit in about 6 weeks to see how he is feeling. If he has any issues with the metoprolol in the meantime, please have him call us . Things to watch for are  dizziness/lightheadedness (especially given that his blood pressure runs on the low side), fatigue, or feeling like he might pass out (or actually passing out--rare but important to get immediate  medical attention if this happens). If he doesn't tolerate the metoprolol, we can talk about other medication options at a followup visit. ----- Message ----- From: Sheryle Donning, MD Sent: 12/08/2023   7:43 PM EDT To: Sheryle Donning, MD

## 2023-12-09 NOTE — Telephone Encounter (Signed)
 Called and left voice message to call back

## 2023-12-09 NOTE — Telephone Encounter (Signed)
 Called and spoke to pt. Full name and DOB verified. Discussed MD's recommends on monitor. Pt aware and is in agreement to plan. Echo ordered and Metoprolol sent to pharmacy. Pt scheduled to see Dr. Veryl Gottron on 7/18   ----- Message from Sheryle Donning sent at 12/08/2023  8:06 PM EDT ----- Please call the patient and relay the following:   The monitor showed frequent PVCs. Based on this, I'd recommend getting an echo to determine if these frequent abnormal beats are affecting the pump function of the heart. We typically use metoprolol to help treat these beats. He does have occasional slower heart rates, but his average heart rate was 90 bpm, suggesting he may tolerate a low dose of metoprolol. I'd start metoprolol succinate 25 mg daily and bring him back for a follow up visit in about 6 weeks to see how he is feeling. If he has any issues with the metoprolol in the meantime, please have him call us . Things to watch for are dizziness/lightheadedness (especially given that his blood pressure runs on the low side), fatigue, or feeling like he might pass out (or actually passing out--rare but important to get immediate medical attention if this happens). If he doesn't tolerate the metoprolol, we can talk about other medication options at a followup visit.

## 2023-12-09 NOTE — Telephone Encounter (Signed)
Patient is returning LPN's call. Please advise. 

## 2023-12-10 ENCOUNTER — Other Ambulatory Visit (HOSPITAL_BASED_OUTPATIENT_CLINIC_OR_DEPARTMENT_OTHER): Payer: Self-pay | Admitting: Cardiology

## 2023-12-10 ENCOUNTER — Telehealth: Payer: Self-pay | Admitting: Cardiology

## 2023-12-10 MED ORDER — METOPROLOL SUCCINATE ER 25 MG PO TB24: 25.0000 mg | ORAL_TABLET | Freq: Every day | ORAL | 3 refills | Status: AC

## 2023-12-10 NOTE — Telephone Encounter (Signed)
 Pt c/o medication issue:  1. Name of Medication: metoprolol succinate (TOPROL XL) 50 MG 24 hr tablet   2. How are you currently taking this medication (dosage and times per day)?    3. Are you having a reaction (difficulty breathing--STAT)? no  4. What is your medication issue? Calling to see if the prescription can be writing for 25mg  instead of 50mg . Unable to cut the prescription in half. Please advise

## 2023-12-10 NOTE — Telephone Encounter (Signed)
 Rx updated and sent to pharmacy

## 2023-12-16 ENCOUNTER — Other Ambulatory Visit: Payer: Self-pay | Admitting: Acute Care

## 2023-12-16 DIAGNOSIS — Z87891 Personal history of nicotine dependence: Secondary | ICD-10-CM

## 2023-12-16 DIAGNOSIS — Z122 Encounter for screening for malignant neoplasm of respiratory organs: Secondary | ICD-10-CM

## 2023-12-18 ENCOUNTER — Ambulatory Visit (HOSPITAL_COMMUNITY)
Admission: RE | Admit: 2023-12-18 | Discharge: 2023-12-18 | Disposition: A | Discharge status: Home / Self Care | Source: Ambulatory Visit | Attending: Vascular Surgery | Admitting: Vascular Surgery

## 2023-12-18 ENCOUNTER — Encounter: Payer: Self-pay | Admitting: Vascular Surgery

## 2023-12-18 ENCOUNTER — Ambulatory Visit (HOSPITAL_COMMUNITY): Admitting: Vascular Surgery

## 2023-12-18 VITALS — BP 100/64 | HR 66 | Temp 98.6°F | Ht 67.0 in | Wt 127.3 lb

## 2023-12-18 DIAGNOSIS — I739 Peripheral vascular disease, unspecified: Secondary | ICD-10-CM

## 2023-12-18 LAB — VAS US ABI WITH/WO TBI: Left ABI: 0.79

## 2023-12-18 NOTE — Progress Notes (Signed)
 Patient ID: Alexander Hunter, male   DOB: Aug 11, 1950, 73 y.o.   MRN: [id number]  Reason for Consult: New Patient (Initial Visit)   Referred by Harden Jerona GAILS, MD  Subjective:     HPI:  Alexander Hunter is a 73 y.o. male without significant history of vascular disease but does have a history of myocardial infarction's status post coronary bypass grafting at [facility].  He also has hyperlipidemia for which he takes statins.  He previously was on aspirin  but stopped that.  He has pain in his left leg with swelling of the ankle and is planned for possible ankle replacement with Dr. Harden.  He denies any frank claudication but does not really walk as far due to pain in the ankle.  He denies tissue loss or ulceration.  No history of stroke, TIA or amaurosis and denies personal or family history of aneurysm disease.  Past Medical History:  Diagnosis Date   Back pain    states has been like this for yrs   Coronary artery disease    History of blood transfusion 11/2014   no abnormal reaction noted   HLD (hyperlipidemia)    takes Atorvastatin  daily   Insomnia    doesn't take any meds   Myocardial infarction (HCC) 11/2014   Nocturia    Pneumonia    as a child   Shortness of breath dyspnea    rarely but notices with exertion.States doesn't happen during cardiac reheab   Family History  Problem Relation Age of Onset   Heart attack Mother    Heart disease Mother        Heart attack   Heart attack Father    Heart disease Father        Heart attack   Heart failure Brother    COPD Brother    Asthma Son        As a child   Heart attack Maternal Grandmother    Heart disease Maternal Grandmother        Heart attack   Heart attack Maternal Grandfather    Heart disease Maternal Grandfather        Heart attack   Past Surgical History:  Procedure Laterality Date   CARDIAC CATHETERIZATION  12-16-14   CORONARY ARTERY BYPASS GRAFT  11/2014   x 4-done at [facility]   HERNIA REPAIR     as a child    INGUINAL HERNIA REPAIR Bilateral 06/01/2015   Procedure: OPEN  BILATERAL HERNIA REPAIR;  Surgeon: Herlene Beverley Bureau, MD;  Location: [facility] OR;  Service: General;  Laterality: Bilateral;   INSERTION OF MESH Bilateral 06/01/2015   Procedure: INSERTION OF MESH;  Surgeon: Herlene Beverley Kinsinger, MD;  Location: [facility] OR;  Service: General;  Laterality: Bilateral;   TONSILLECTOMY AND ADENOIDECTOMY     as a child    Short Social History:  Social History   Tobacco Use   Smoking status: Former    Current packs/day: 0.00    Average packs/day: 1 pack/day for 30.0 years (30.0 ttl pk-yrs)    Types: Cigarettes    Start date: 66    Quit date: 2016    Years since quitting: 9.4   Smokeless tobacco: Never   Tobacco comments:    Quit smoking 12/2014  Substance Use Topics   Alcohol use: Yes    Comment: 1 mixed drink a day and 2 beers.    No Known Allergies  Current Outpatient Medications  Medication Sig Dispense Refill   aspirin  EC  81 MG tablet Take 1 tablet (81 mg total) by mouth daily. Swallow whole. 90 tablet 3   atorvastatin  (LIPITOR) 80 MG tablet Take 1 tablet by mouth each evening. 90 tablet 3   diclofenac  Sodium (VOLTAREN ) 1 % GEL Apply 2 g topically 4 (four) times daily. 100 g 2   ezetimibe  (ZETIA ) 10 MG tablet Take 1 tablet by mouth once daily 30 tablet 0   lisinopril  (ZESTRIL ) 5 MG tablet Take 1 tablet (5 mg total) by mouth daily. 90 tablet 3   methylPREDNISolone  (MEDROL  DOSEPAK) 4 MG TBPK tablet Take as directed 21 tablet 0   metoprolol succinate (TOPROL XL) 25 MG 24 hr tablet Take 1 tablet (25 mg total) by mouth daily. Take with or immediately following a meal. 90 tablet 3   Multiple Vitamin (ONE-A-DAY MENS PO) Take by mouth. Per patient gel caps     No current facility-administered medications for this visit.    Review of Systems  Constitutional:  Constitutional negative. HENT: HENT negative.  Eyes: Eyes negative.  GI: Gastrointestinal negative.  Musculoskeletal: Positive for gait  problem, leg pain and joint pain.  Hematologic: Hematologic/lymphatic negative.  Psychiatric: Psychiatric negative.        Objective:  Objective   Vitals:   12/18/23 1043  BP: 100/64  Pulse: 66  Temp: 98.6 F (37 C)  SpO2: 97%  Weight: 127 lb 4.8 oz (57.7 kg)  Height: 5' 7 (1.702 m)   Body mass index is 19.94 kg/m.  Physical Exam HENT:     Head: Normocephalic.     Nose: Nose normal.   Eyes:     Pupils: Pupils are equal, round, and reactive to light.    Cardiovascular:     Rate and Rhythm: Normal rate and regular rhythm.     Pulses:          Femoral pulses are 2+ on the right side and 2+ on the left side.      Popliteal pulses are 0 on the left side.       Dorsalis pedis pulses are 0 on the left side.       Posterior tibial pulses are 0 on the left side.  Pulmonary:     Effort: Pulmonary effort is normal.  Abdominal:     General: Abdomen is flat.   Musculoskeletal:     Right lower leg: No edema.     Left lower leg: Edema present.   Skin:    General: Skin is warm.   Neurological:     General: No focal deficit present.     Mental Status: He is alert.   Psychiatric:        Mood and Affect: Mood normal.     Data: ABI Findings:  +---------+------------------+-----+----------+--------+  Right   Rt Pressure (mmHg)IndexWaveform  Comment   +---------+------------------+-----+----------+--------+  Brachial 116                                        +---------+------------------+-----+----------+--------+  ATA     106               0.91 triphasic           +---------+------------------+-----+----------+--------+  PTA     254               2.19 monophasic          +---------+------------------+-----+----------+--------+  Burnetta Bryant  0.36                     +---------+------------------+-----+----------+--------+   +---------+------------------+-----+----------+-------+  Left    Lt Pressure  (mmHg)IndexWaveform  Comment  +---------+------------------+-----+----------+-------+  Brachial 116                                       +---------+------------------+-----+----------+-------+  ATA     88                0.76 triphasic          +---------+------------------+-----+----------+-------+  PTA     92                0.79 monophasic         +---------+------------------+-----+----------+-------+  Great Toe63                0.54                    +---------+------------------+-----+----------+-------+   +-------+---------------+-----------+---------------+------------+  ABI/TBIToday's ABI    Today's TBIPrevious ABI   Previous TBI  +-------+---------------+-----------+---------------+------------+  Right Noncompressible0.36       Noncompressible0.35          +-------+---------------+-----------+---------------+------------+  Left  0.79           0.54       0.73           0.75          +-------+---------------+-----------+---------------+------------+         Bilateral ABIs and TBIs appear essentially unchanged compared to prior  study on 05/06/2023.    Summary:  Right: Resting right ankle-brachial index indicates mild right lower  extremity arterial disease. The right toe-brachial index is abnormal.   Left: Resting left ankle-brachial index indicates moderate left lower  extremity arterial disease. The left toe-brachial index is abnormal.       Assessment/Plan:    73 year old male with left ankle pain in need of extensive left ankle surgery at the discretion of Dr. Harden.  Given that he has decreased ABIs without palpable pulses below the femoral he has been recommended for angiography from right common femoral approach to possible revascularize the left lower extremity.  At this time patient is unsure that he is going to proceed with ankle surgery.  As such he can call to schedule angiogram as noted above prior to undergoing  ankle surgery.  All questions were answered and he demonstrates good understanding.     Penne Lonni Colorado MD [facility]

## 2023-12-24 ENCOUNTER — Ambulatory Visit (HOSPITAL_BASED_OUTPATIENT_CLINIC_OR_DEPARTMENT_OTHER): Admitting: Family

## 2024-01-01 ENCOUNTER — Ambulatory Visit

## 2024-01-01 DIAGNOSIS — M19072 Primary osteoarthritis, left ankle and foot: Secondary | ICD-10-CM

## 2024-01-01 DIAGNOSIS — M76822 Posterior tibial tendinitis, left leg: Secondary | ICD-10-CM

## 2024-01-01 NOTE — Progress Notes (Signed)
  Patient was present and evaluated for Custom molded foot orthotics. Patient will benefit from CFO's to provide total contact to BIL MLA's helping to balance and distribute body weight more evenly across BIL feet helping to reduce plantar pressure and pain. Orthotic will also encourage FF / RF alignment  Patient was scanned today and will return for fitting upon receipt   New orthotics ordered in cork base from [employer]  NO AUTH NEEDED FOR [phone]

## 2024-01-03 ENCOUNTER — Telehealth: Payer: Self-pay

## 2024-01-03 NOTE — Telephone Encounter (Signed)
 HTA PA received and approved  Auth# [id number]  12/16/23- 03/15/24

## 2024-01-04 ENCOUNTER — Other Ambulatory Visit: Payer: Self-pay | Admitting: Family Medicine

## 2024-01-07 ENCOUNTER — Ambulatory Visit (HOSPITAL_BASED_OUTPATIENT_CLINIC_OR_DEPARTMENT_OTHER)

## 2024-01-07 DIAGNOSIS — I493 Ventricular premature depolarization: Secondary | ICD-10-CM

## 2024-01-07 LAB — ECHOCARDIOGRAM COMPLETE
Area-P 1/2: 2.87 cm2
MV M vel: 4.76 m/s
MV Peak grad: 90.6 mmHg
Radius: 0.8 cm
S' Lateral: 2.66 cm

## 2024-01-08 ENCOUNTER — Telehealth: Payer: Self-pay

## 2024-01-08 NOTE — Telephone Encounter (Signed)
 Pt called back returning our call. He wants to proceed with AGM but would like to set up his transportation first. He plans to do that and then call us  back to schedule for sometime next month.

## 2024-01-08 NOTE — Telephone Encounter (Signed)
 Attempted to reach pt to schedule his AGM. LVM for him to return our call.

## 2024-01-09 DIAGNOSIS — M19072 Primary osteoarthritis, left ankle and foot: Secondary | ICD-10-CM | POA: Diagnosis not present

## 2024-01-10 ENCOUNTER — Ambulatory Visit (HOSPITAL_BASED_OUTPATIENT_CLINIC_OR_DEPARTMENT_OTHER): Admitting: Cardiology

## 2024-01-10 ENCOUNTER — Encounter (HOSPITAL_BASED_OUTPATIENT_CLINIC_OR_DEPARTMENT_OTHER): Payer: Self-pay | Admitting: Cardiology

## 2024-01-10 VITALS — BP 123/50 | HR 68 | Ht 67.0 in | Wt 132.7 lb

## 2024-01-10 DIAGNOSIS — E7800 Pure hypercholesterolemia, unspecified: Secondary | ICD-10-CM

## 2024-01-10 DIAGNOSIS — I493 Ventricular premature depolarization: Secondary | ICD-10-CM | POA: Diagnosis not present

## 2024-01-10 DIAGNOSIS — I739 Peripheral vascular disease, unspecified: Secondary | ICD-10-CM | POA: Diagnosis not present

## 2024-01-10 DIAGNOSIS — I255 Ischemic cardiomyopathy: Secondary | ICD-10-CM | POA: Diagnosis not present

## 2024-01-10 DIAGNOSIS — I2510 Atherosclerotic heart disease of native coronary artery without angina pectoris: Secondary | ICD-10-CM

## 2024-01-10 DIAGNOSIS — Z712 Person consulting for explanation of examination or test findings: Secondary | ICD-10-CM | POA: Diagnosis not present

## 2024-01-10 DIAGNOSIS — Z951 Presence of aortocoronary bypass graft: Secondary | ICD-10-CM | POA: Diagnosis not present

## 2024-01-10 NOTE — Progress Notes (Signed)
 Cardiology Office Note:  .   Date:  01/10/2024  ID:  Alexander Hunter, DOB 02/13/1951, MRN [id number] PCP: Billy Philippe SAUNDERS, NP  [facility] Providers Cardiologist:  Shelda Bruckner, MD {  History of Present Illness: .   Alexander Hunter is a 73 y.o. male with a hx of CABG 2016 ([facility]) after presenting with inferior STEMI and unable to open RCA with stent, ischemic cardiomyopathy, PVCs, PAD. I met him 11/30/22.  Pertinent CV history: ischemic heart disease status post bypass surgery June 2016 (LIMA to the LAD, SVG to ramus, SVG to obtuse marginal, SVG to PDA). Presented with acute MI, had failed PCI to RCA, then sent to bypass. Prior EF 45-50% in 2018. Abnormal ABIs without symptoms. Repeat ABI 04/2023 with noncompressible study on the right, moderate PAD on the left. Repeat ABI in 2025 was unchanged  Monitor 10/2023 showed 23% PVC burden (daily range 13-32%). Echo showed EF 50-55%, with basal inferior and septal akinesis. Does not have a prior echo here (prior workup done at [facility], cannot see full results) but on note from 03/16/2027, noted that exercise nuclear showed no ischemia but inferior scar (did 7 METs), echo with EF 45-50% with inferior wall hypokinesis.  Presenting symptoms at time of MI was diaphoresis, not chest pain.  Today: Reviewed echo, monitor, ABI results today. Monitor showed frequent PVCs (23% burden). Started on metoprolol . Echo as above, discussed that we can't see prior images but by comparison appears similar to prior in 2018.  Overall he is feeling well. Notes that he has been sleeping more, which he feels like is because he hasn't been as active since he retired. Didn't notice any changes in fatigue with starting metoprolol . He does feel like his heart has slowed down, hasn't felt it jumping around as much.  Reviewed lipids from 5/25. LDL 95, down from 122 but goal is <70. He is on atorvastatin  80 mg and ezetimibe  10 mg daily. Discussed PCSK9i today.  No  bleeding issues. He read an article recently that said that baby aspirin  isn't needed, but we discussed that this is for people without high risk or known heart disease. He will restart.  Able to walk without issue, limited most by arthritis in his left ankle. No claudication, no shortness of breath, no chest pain.  ROS: Denies chest pain, shortness of breath at rest or with normal exertion. No PND, orthopnea, LE edema or unexpected weight gain. No syncope. ROS otherwise negative except as noted.   Studies Reviewed: SABRA    EKG:       Physical Exam:   VS:  BP (!) 123/50   Pulse 68   Ht 5' 7 (1.702 m)   Wt 132 lb 11.2 oz (60.2 kg)   SpO2 98%   BMI 20.78 kg/m    Wt Readings from Last 3 Encounters:  01/10/24 132 lb 11.2 oz (60.2 kg)  12/18/23 127 lb 4.8 oz (57.7 kg)  11/01/23 137 lb 8 oz (62.4 kg)    GEN: Well nourished, well developed in no acute distress HEENT: Normal, moist mucous membranes NECK: No JVD CARDIAC: regular rhythm with rare ectopy, normal S1 and S2, no rubs or gallops. No murmur. VASCULAR: Radial pulses 2+ bilaterally. Decreased DP pulses bilaterally. No carotid bruits RESPIRATORY:  Clear to auscultation without rales, wheezing or rhonchi  ABDOMEN: Soft, non-tender, non-distended MUSCULOSKELETAL:  Ambulates independently SKIN: Warm and dry, no edema NEUROLOGIC:  Alert and oriented x 3. No focal neuro deficits noted. PSYCHIATRIC:  Normal affect  ASSESSMENT AND PLAN: .    CAD s/p prior CABG 2016 Ischemic cardiomyopathy Hypercholesterolemia PAD without symptoms -reviewed recent lipids, LDL 95, not at goal. We have discussed PCSK9i, he will consider. Would prefer Leqvio if covered by his insurance plan. Will refer to Pharm[facility] lipid clinic to discuss options -continue aspirin  81 mg daily -continue atorvastatin  80 mg daily, ezetimibe  10 mg daily. Could consider dropping ezetimibe  if he starts PKS(i -reviewed echo as above. Tolerating low dose metoprolol  succinate and  lisinopril  5 mg daily. No BP room to increase -NYHA class 1 -reviewed his ABIs, stable -reviewed red flag warning signs that need immediate medical attention  PVCs -reviewed monitor, echo today -started low dose metoprolol , tolerating well  CV risk counseling and prevention -recommend heart healthy/Mediterranean diet, with whole grains, fruits, vegetable, fish, lean meats, nuts, and olive oil. Limit salt. -recommend moderate walking, 3-5 times/week for 30-50 minutes each session. Aim for at least 150 minutes.week. Goal should be pace of 3 miles/hours, or walking 1.5 miles in 30 minutes -recommend avoidance of tobacco products. Avoid excess alcohol.  Dispo: 6 mos or sooner as needed  Total time of encounter: I spent 45 minutes dedicated to the care of this patient on the date of this encounter to include pre-visit review of records, face-to-face time with the patient discussing conditions above, and clinical documentation with the electronic health record. We specifically spent time today discussing results of his testing, recommendations re: aspirin  and lipids, review of signs/symptoms to watch for   Signed, Shelda Bruckner, MD   Shelda Bruckner, MD, PhD, [facility]  [facility]  [place]  [facility] at [facility] at [facility] [address] [place], [place] [address] [phone]

## 2024-01-10 NOTE — Patient Instructions (Signed)
 Medication Instructions:  Continue current medications *If you need a refill on your cardiac medications before your next appointment, please call your pharmacy*  Lab Work: none If you have labs (blood work) drawn today and your tests are completely normal, you will receive your results only by: MyChart Message (if you have MyChart) OR A paper copy in the mail If you have any lab test that is abnormal or we need to change your treatment, we will call you to review the results.  Testing/Procedures: none  Follow-Up: At [facility], you and your health needs are our priority.  As part of our continuing mission to provide you with exceptional heart care, our providers are all part of one team.  This team includes your primary Cardiologist (physician) and Advanced Practice Providers or APPs (Physician Assistants and Nurse Practitioners) who all work together to provide you with the care you need, when you need it.  Your next appointment:   6 month(s)  Provider:   Shelda Bruckner, MD    We recommend signing up for the patient portal called MyChart.  Sign up information is provided on this After Visit Summary.  MyChart is used to connect with patients for Virtual Visits (Telemedicine).  Patients are able to view lab/test results, encounter notes, upcoming appointments, etc.  Non-urgent messages can be sent to your provider as well.   To learn more about what you can do with MyChart, go to [email or web address].   Other Instructions Referral to Lipid Clinic

## 2024-01-10 NOTE — Telephone Encounter (Signed)
 Prior auth [id number] approved for [place]  brace sending to [employer] via Fax  Valid 6.23.25-9.21.25

## 2024-01-13 ENCOUNTER — Telehealth: Payer: Self-pay | Admitting: Pharmacy Technician

## 2024-01-13 ENCOUNTER — Other Ambulatory Visit (HOSPITAL_COMMUNITY): Payer: Self-pay

## 2024-01-13 ENCOUNTER — Ambulatory Visit: Attending: Internal Medicine | Admitting: Pharmacist

## 2024-01-13 DIAGNOSIS — I25119 Atherosclerotic heart disease of native coronary artery with unspecified angina pectoris: Secondary | ICD-10-CM

## 2024-01-13 DIAGNOSIS — E7800 Pure hypercholesterolemia, unspecified: Secondary | ICD-10-CM | POA: Diagnosis not present

## 2024-01-13 NOTE — Patient Instructions (Addendum)
 Let me know if you are taking the ezetimbie (Zetia ) Continue taking atorvastatin  80mg  daily  I will submit a prior authorization for Repatha . I will call you once I hear back. Please call me at [phone] with any questions.   Repatha  is a cholesterol medication that improved your body's ability to get rid of bad cholesterol known as LDL. It can lower your LDL up to 60%! It is an injection that is given under the skin every 2 weeks. The medication often requires a prior authorization from your insurance company. We will take care of submitting all the necessary information to your insurance company to get it approved. The most common side effects of Repatha  include runny nose, symptoms of the common cold, rarely flu or flu-like symptoms, back/muscle pain in about 3-4% of the patients, and redness, pain, or bruising at the injection site. Tell your healthcare provider if you have any side effect that bothers you or that does not go away.   Hyperlipidemia Foods high in saturated fat tend to increase LDL (bad) cholesterol the most.  Not all fat is bad fat! Foods higher in unsaturated fat are healthy, like fish, nuts, and avocadoes. Overall, following a diet like the Mediterranean diet can help to improve your cholesterol. Hypertriglyceridemia Foods high in carbohydrates and sugar, as well as alcohol, can increase your triglycerides. If you are diabetic, poorly controlled blood sugar can also increase your triglycerides. A non-fasting state can affect the triglyceride level in your lab work. Please make sure you are fasting to improve accuracy of this lab test.  Eat more of these Eat less of these  Carbohydrates Fiber-rich whole grains: oats, whole wheat pasta or bread, quinoa, barley, oats and brown rice Aim for  of your plate to be whole grains Men: aim for > 38 grams of fiber per day Women: aim for > 25 grams of fiber per day Refined grains: white bread, rice, or pasta, macaroni and  cheese Foods with added sugar Processed foods: desserts like cake, cookies, donuts, muffins, and pastries; microwave meals, chips, [place] fries  Fruits and vegetables A variety of bright colored fruits and vegetables: spinach, broccoli, tomatoes, carrots, berries,  oranges, apples, bananas, berries, and melon Aim for  of your plate to be fruits/vegetables Canned vegetables Starchy vegetables like potatoes Canned fruit in heavy syrup  Protein Lean meat: skinless chicken or [place] Fish: salmon, trout, tuna, cod, tilapia, flounder, etc Legumes: beans, lentils, chickpeas, tofu, nuts Aim for  of your plate to be protein Red, fatty, or fried meat Processed foods: deli meat, hot dogs, burgers, pizza, fast food   Dairy, fats and oils Unsaturated fats: fish, nuts, and avocadoes  Low fat or fat free milk or yogurt Olive and canola oil Saturated fats: butter, lard, cream, coconut oil Whole milk and other full fat dairy products like cheese Sugar-sweetened dairy products (many yogurts have added sugar)  Drinks Water: plain or sparkling Sugar free or diet drinks Unsweet tea or coffee Keep added sugar intake to  < 6 teaspoons (24 grams) Regular soda Fruit juice Alcohol  Other ways to adopt a healthy lifestyle:  Exercise:  Exercise: Aim for 150 min of moderate intensity exercise weekly for heart health, and weights twice weekly for bone health. Stay active - any steps are better than no steps!  Sleep: Aim for 7-9 hours of sleep nightly.  Weight: Know what a healthy weight is for you (roughly BMI <25) and aim to maintain this. Unfortunately, this is not the most  accurate measure of healthy weight, but it is the simplest measurement to use. A more accurate measurement involves body scanning which measures lean muscle, fat tissue and bony density. We do not have this equipment at [facility].

## 2024-01-13 NOTE — Progress Notes (Signed)
 Patient ID: Chares Slaymaker                 DOB: 11/27/1950                    MRN: [id number]      HPI: Alexander Hunter is a 73 y.o. male patient referred to lipid clinic by Dr. Lonni. PMH is significant for CABG 2016 ([facility]) after presenting with inferior STEMI and unable to open RCA with stent, ischemic cardiomyopathy, PVCs, PAD.  Latest LDL-C 95.  Patient presents today to talk about options to further lower cholesterol.  He is unsure if he has been taking ezetimibe  but knows he has been taking atorvastatin  80 mg daily faithfully.  Reviewed PCSK9 inhibitor including dosing, side effects, potential decreases in LDL cholesterol and ASCVD risk reduction and cost.  Injection technique reviewed with patient.   Current Medications: atorvastatin  80 mg daily, ezetimibe  10 mg daily? Intolerances:  Risk Factors: MI, PAD LDL-C goal: <70 (could consider <55) ApoB goal: <80  Diet:  Trying to eat 2 meals a day Cooks at home mostly Uses butter Does eat fried foods  Exercise: walks 5 min daily-limited due to ankle arthritis  Family History:  Family History  Problem Relation Age of Onset   Heart attack Mother    Heart disease Mother        Heart attack   Heart attack Father    Heart disease Father        Heart attack   Heart failure Brother    COPD Brother    Asthma Son        As a child   Heart attack Maternal Grandmother    Heart disease Maternal Grandmother        Heart attack   Heart attack Maternal Grandfather    Heart disease Maternal Grandfather        Heart attack   Social History: no tobacco, beer or two everyday  Labs: Lipid Panel     Component Value Date/Time   CHOL 186 10/25/2023 1126   CHOL 246 (H) 03/07/2023 1054   TRIG 43.0 10/25/2023 1126   HDL 82.60 10/25/2023 1126   HDL 106 03/07/2023 1054   CHOLHDL 2 10/25/2023 1126   VLDL 8.6 10/25/2023 1126   LDLCALC 95 10/25/2023 1126   LDLCALC 129 (H) 03/07/2023 1054   LABVLDL 11 03/07/2023 1054    Past Medical  History:  Diagnosis Date   Back pain    states has been like this for yrs   Coronary artery disease    History of blood transfusion 11/2014   no abnormal reaction noted   HLD (hyperlipidemia)    takes Atorvastatin  daily   Insomnia    doesn't take any meds   Myocardial infarction (HCC) 11/2014   Nocturia    Pneumonia    as a child   Shortness of breath dyspnea    rarely but notices with exertion.States doesn't happen during cardiac reheab    Current Outpatient Medications on File Prior to Visit  Medication Sig Dispense Refill   aspirin  EC 81 MG tablet Take 1 tablet (81 mg total) by mouth daily. Swallow whole. 90 tablet 3   atorvastatin  (LIPITOR) 80 MG tablet Take 1 tablet by mouth each evening. 90 tablet 3   diclofenac  Sodium (VOLTAREN ) 1 % GEL Apply 2 g topically 4 (four) times daily. 100 g 2   lisinopril  (ZESTRIL ) 5 MG tablet Take 1 tablet (5 mg total) by mouth daily.  90 tablet 3   metoprolol  succinate (TOPROL  XL) 25 MG 24 hr tablet Take 1 tablet (25 mg total) by mouth daily. Take with or immediately following a meal. 90 tablet 3   Multiple Vitamin (ONE-A-DAY MENS PO) Take by mouth. Per patient gel caps     ezetimibe  (ZETIA ) 10 MG tablet Take 1 tablet by mouth once daily 30 tablet 0   No current facility-administered medications on file prior to visit.    No Known Allergies  Assessment/Plan:  1. Hyperlipidemia -  Hypercholesteremia Assessment: LDL-C above goal of less than 70 on atorvastatin  80 mg daily Patient unsure if he is taking ezetimibe  Either way patient needs further LDL-C reduction Reviewed PCSK9 inhibitor including dosing, side effects, potential decreases in LDL cholesterol and ASCVD risk reduction and cost.  Injection technique reviewed with patient. Reviewed diet, encouraged decrease in saturated fat (less butter and fried foods) Encouraged him to decrease alcohol intake Try to find ways to increase exercise that does not bother his ankle Stressed that we  will continue atorvastatin  80 mg daily and add new agent  Plan: Prior authorization for Repatha -he will need healthwell grant.  Agreeable to get from a [facility] pharmacy Continue atorvastatin  80 mg daily He will let me know if he has been taking ezetimibe  Repeat labs in 3 months    Thank you,  Ren Aspinall D Ismeal Heider, Pharm.JONETTA SARAN, CPP [facility] of [facility] [address], [place], [place] [address]  Phone: [phone]; Fax: [phone]

## 2024-01-13 NOTE — Telephone Encounter (Signed)
 Pharmacy Patient Advocate Encounter  Received notification from [employer] that Prior Authorization for Repatha  has been APPROVED from 01/13/24 to 07/11/24. Ran test claim, Copay is $47.00- one month  $117.50- 3  months. This test claim was processed through [facility]- copay amounts may vary at other pharmacies due to pharmacy/plan contracts, or as the patient moves through the different stages of their insurance plan.   PA #/Case ID/Reference #: [id number]

## 2024-01-13 NOTE — Assessment & Plan Note (Signed)
 Assessment: LDL-C above goal of less than 70 on atorvastatin  80 mg daily Patient unsure if he is taking ezetimibe  Either way patient needs further LDL-C reduction Reviewed PCSK9 inhibitor including dosing, side effects, potential decreases in LDL cholesterol and ASCVD risk reduction and cost.  Injection technique reviewed with patient. Reviewed diet, encouraged decrease in saturated fat (less butter and fried foods) Encouraged him to decrease alcohol intake Try to find ways to increase exercise that does not bother his ankle Stressed that we will continue atorvastatin  80 mg daily and add new agent  Plan: Prior authorization for Repatha -he will need healthwell grant.  Agreeable to get from a [facility] pharmacy Continue atorvastatin  80 mg daily He will let me know if he has been taking ezetimibe  Repeat labs in 3 months

## 2024-01-13 NOTE — Telephone Encounter (Signed)
 Pharmacy Patient Advocate Encounter   Received notification from Physician's Office that prior authorization for Repatha  is required/requested.   Insurance verification completed.   The patient is insured through [facility] [employer]/RX [employer] .   Per test claim: PA required; PA submitted to above mentioned insurance via CoverMyMeds Key/confirmation #/EOC  [facility] Status is pending

## 2024-01-14 ENCOUNTER — Other Ambulatory Visit (HOSPITAL_COMMUNITY): Payer: Self-pay

## 2024-01-14 ENCOUNTER — Telehealth: Payer: Self-pay | Admitting: Pharmacy Technician

## 2024-01-14 MED ORDER — REPATHA SURECLICK 140 MG/ML ~~LOC~~ SOAJ
1.0000 mL | SUBCUTANEOUS | 11 refills | Status: DC
Start: 2024-01-14 — End: 2024-01-15
  Filled 2024-01-14: qty 2, 28d supply, fill #0

## 2024-01-14 NOTE — Telephone Encounter (Signed)
 Rx sent to [facility] Pt will come tomorrow at 11:30 for his first injection per his request

## 2024-01-14 NOTE — Addendum Note (Signed)
 Addended by: Evalyse Stroope D on: 01/14/2024 12:38 PM   Modules accepted: Orders

## 2024-01-14 NOTE — Telephone Encounter (Signed)
 Patient Advocate Encounter   The patient was approved for a [employer] grant that will help cover the cost of Repatha  Total amount awarded, 2500.00.  Effective: 12/15/23 - 12/13/24   APW:[id number] ERW:[id number] Hmnle:[id number] ID: [id number]  [employer] ID: [id number]   Pharmacy provided with approval and processing information.

## 2024-01-15 ENCOUNTER — Telehealth: Payer: Self-pay | Admitting: Pharmacist

## 2024-01-15 ENCOUNTER — Other Ambulatory Visit (HOSPITAL_COMMUNITY): Payer: Self-pay

## 2024-01-15 MED ORDER — REPATHA SURECLICK 140 MG/ML ~~LOC~~ SOAJ
1.0000 mL | SUBCUTANEOUS | 3 refills | Status: AC
  Filled 2024-01-15 – 2024-02-04 (×2): qty 6, 84d supply, fill #0

## 2024-01-15 NOTE — Telephone Encounter (Signed)
 Patient presents today for Repatha  teaching.  He is taking zetia  Pt successfully injected Repatha  into left abdomen

## 2024-01-16 ENCOUNTER — Other Ambulatory Visit: Payer: Self-pay

## 2024-01-17 ENCOUNTER — Telehealth: Payer: Self-pay

## 2024-01-17 NOTE — Telephone Encounter (Signed)
 Attempted to call for surgery scheduling. LVM

## 2024-01-23 ENCOUNTER — Telehealth: Payer: Self-pay

## 2024-01-23 NOTE — Telephone Encounter (Signed)
 Spoke to patient re: abdominal aortogram with LLE angiogram and possible intervention with Dr. Sheree.  Patient was able to ask questions re: procedure and post procedure.  Patient asked questions re: billing.  This nurse directed him to his insurance company, [employer], to find out more re: cost.

## 2024-01-27 ENCOUNTER — Ambulatory Visit (INDEPENDENT_AMBULATORY_CARE_PROVIDER_SITE_OTHER)

## 2024-01-27 DIAGNOSIS — M76822 Posterior tibial tendinitis, left leg: Secondary | ICD-10-CM | POA: Diagnosis not present

## 2024-01-27 DIAGNOSIS — M2141 Flat foot [pes planus] (acquired), right foot: Secondary | ICD-10-CM | POA: Diagnosis not present

## 2024-01-27 DIAGNOSIS — M2142 Flat foot [pes planus] (acquired), left foot: Secondary | ICD-10-CM

## 2024-01-27 DIAGNOSIS — M19072 Primary osteoarthritis, left ankle and foot: Secondary | ICD-10-CM | POA: Diagnosis not present

## 2024-01-27 NOTE — Progress Notes (Signed)
 Patient presents today to pick up custom molded foot orthotics, diagnosed with Pes planus BIL and Left PTT by Dr. Joya.   Orthotics were dispensed and fit was satisfactory. Reviewed instructions for break-in and wear. Written instructions given to patient.  Patient will follow up as needed.   Lolita Schultze Cped, CFo, CFm

## 2024-02-01 ENCOUNTER — Other Ambulatory Visit: Payer: Self-pay | Admitting: Family Medicine

## 2024-02-04 ENCOUNTER — Other Ambulatory Visit: Payer: Self-pay

## 2024-02-04 ENCOUNTER — Other Ambulatory Visit (HOSPITAL_COMMUNITY): Payer: Self-pay

## 2024-02-05 ENCOUNTER — Other Ambulatory Visit (HOSPITAL_COMMUNITY): Payer: Self-pay

## 2024-02-13 ENCOUNTER — Telehealth: Payer: Self-pay

## 2024-02-13 NOTE — Telephone Encounter (Signed)
 Spoke to patient re: his decision for angio with Dr. Sheree.  Patient states that he rec'd an estimate of cost from [employer] but has transportation issues.  This nurse directed him back to [employer] or Medicare to inquire about cost of overnight stay as well as transportation support.  This nurse will plan on following up the week of 9/8.

## 2024-02-28 ENCOUNTER — Other Ambulatory Visit: Payer: Self-pay | Admitting: Family Medicine

## 2024-03-04 ENCOUNTER — Other Ambulatory Visit: Payer: Self-pay

## 2024-03-04 DIAGNOSIS — I739 Peripheral vascular disease, unspecified: Secondary | ICD-10-CM

## 2024-03-06 ENCOUNTER — Telehealth: Payer: Self-pay | Admitting: Vascular Surgery

## 2024-03-09 NOTE — Telephone Encounter (Signed)
 Appt scheduled

## 2024-03-11 ENCOUNTER — Ambulatory Visit: Attending: Vascular Surgery | Admitting: Vascular Surgery

## 2024-03-11 ENCOUNTER — Encounter: Payer: Self-pay | Admitting: Vascular Surgery

## 2024-03-11 VITALS — BP 154/79 | HR 58 | Temp 98.1°F | Ht 67.0 in | Wt 130.0 lb

## 2024-03-11 DIAGNOSIS — I70413 Atherosclerosis of autologous vein bypass graft(s) of the extremities with intermittent claudication, bilateral legs: Secondary | ICD-10-CM

## 2024-03-11 NOTE — Progress Notes (Signed)
 Patient ID: Alexander Hunter, male   DOB: August 29, 1950, 73 y.o.   MRN: [id number]  Reason for Consult: No chief complaint on file.   Referred by Billy Philippe SAUNDERS, NP  Subjective:     HPI:  Alexander Hunter is a 73 y.o. male previously evaluated for ankle pain with possible need for future ankle replacement on the left.  He also endorses short distance claudication sometimes at less than 1 block distance.  Denies tissue loss or ulceration.  Does take aspirin  daily.  He is a former smoker quit about 10 years ago.  Does have a history of coronary artery disease as well as hyperlipidemia.  No history of stroke, TIA or amaurosis.  Past Medical History:  Diagnosis Date   Back pain    states has been like this for yrs   Coronary artery disease    History of blood transfusion 11/2014   no abnormal reaction noted   HLD (hyperlipidemia)    takes Atorvastatin  daily   Insomnia    doesn't take any meds   Myocardial infarction (HCC) 11/2014   Nocturia    Pneumonia    as a child   Shortness of breath dyspnea    rarely but notices with exertion.States doesn't happen during cardiac reheab   Family History  Problem Relation Age of Onset   Heart attack Mother    Heart disease Mother        Heart attack   Heart attack Father    Heart disease Father        Heart attack   Heart failure Brother    COPD Brother    Asthma Son        As a child   Heart attack Maternal Grandmother    Heart disease Maternal Grandmother        Heart attack   Heart attack Maternal Grandfather    Heart disease Maternal Grandfather        Heart attack   Past Surgical History:  Procedure Laterality Date   CARDIAC CATHETERIZATION  12-16-14   CORONARY ARTERY BYPASS GRAFT  11/2014   x 4-done at [facility]   HERNIA REPAIR     as a child   INGUINAL HERNIA REPAIR Bilateral 06/01/2015   Procedure: OPEN  BILATERAL HERNIA REPAIR;  Surgeon: Herlene Beverley Bureau, MD;  Location: [facility] OR;  Service: General;  Laterality: Bilateral;    INSERTION OF MESH Bilateral 06/01/2015   Procedure: INSERTION OF MESH;  Surgeon: Herlene Beverley Kinsinger, MD;  Location: [facility] OR;  Service: General;  Laterality: Bilateral;   TONSILLECTOMY AND ADENOIDECTOMY     as a child    Short Social History:  Social History   Tobacco Use   Smoking status: Former    Current packs/day: 0.00    Average packs/day: 1 pack/day for 30.0 years (30.0 ttl pk-yrs)    Types: Cigarettes    Start date: 46    Quit date: 2016    Years since quitting: 9.7   Smokeless tobacco: Never   Tobacco comments:    Quit smoking 12/2014  Substance Use Topics   Alcohol use: Yes    Comment: 1 mixed drink a day and 2 beers.    No Known Allergies  Current Outpatient Medications  Medication Sig Dispense Refill   aspirin  EC 81 MG tablet Take 1 tablet (81 mg total) by mouth daily. Swallow whole. 90 tablet 3   atorvastatin  (LIPITOR) 80 MG tablet Take 1 tablet by mouth each evening. 90 tablet  3   diclofenac  Sodium (VOLTAREN ) 1 % GEL Apply 2 g topically 4 (four) times daily. 100 g 2   Evolocumab  (REPATHA  SURECLICK) 140 MG/ML SOAJ Inject 140 mg into the skin every 14 (fourteen) days. Please apply healthwell grant and deliver 6 mL 3   ezetimibe  (ZETIA ) 10 MG tablet Take 1 tablet by mouth once daily 30 tablet 0   lisinopril  (ZESTRIL ) 5 MG tablet Take 1 tablet (5 mg total) by mouth daily. 90 tablet 3   metoprolol  succinate (TOPROL  XL) 25 MG 24 hr tablet Take 1 tablet (25 mg total) by mouth daily. Take with or immediately following a meal. 90 tablet 3   Multiple Vitamin (ONE-A-DAY MENS PO) Take by mouth. Per patient gel caps     No current facility-administered medications for this visit.    Review of Systems  Constitutional:  Constitutional negative. HENT: HENT negative.  Eyes: Eyes negative.  Cardiovascular: Positive for claudication.  GI: Gastrointestinal negative.  Musculoskeletal: Positive for leg pain.  Skin: Skin negative.  Neurological: Neurological  negative. Hematologic: Hematologic/lymphatic negative.  Psychiatric: Psychiatric negative.        Objective:  Objective  Vitals:   03/11/24 1045  BP: (!) 154/79  Pulse: (!) 58  Temp: 98.1 F (36.7 C)  SpO2: 99%     Physical Exam HENT:     Head: Normocephalic.     Nose: Nose normal.  Eyes:     Pupils: Pupils are equal, round, and reactive to light.  Cardiovascular:     Rate and Rhythm: Normal rate.     Pulses:          Femoral pulses are 2+ on the right side and 2+ on the left side.      Popliteal pulses are 0 on the right side and 0 on the left side.  Pulmonary:     Effort: Pulmonary effort is normal.  Abdominal:     General: Abdomen is flat.  Musculoskeletal:     Right lower leg: No edema.     Left lower leg: No edema.  Skin:    General: Skin is warm.     Capillary Refill: Capillary refill takes less than 2 seconds.  Neurological:     General: No focal deficit present.     Mental Status: He is alert.  Psychiatric:        Mood and Affect: Mood normal.        Thought Content: Thought content normal.        Judgment: Judgment normal.     Data: ABI Findings:  +---------+------------------+-----+----------+--------+  Right   Rt Pressure (mmHg)IndexWaveform  Comment   +---------+------------------+-----+----------+--------+  Brachial 116                                        +---------+------------------+-----+----------+--------+  ATA     106               0.91 triphasic           +---------+------------------+-----+----------+--------+  PTA     254               2.19 monophasic          +---------+------------------+-----+----------+--------+  Great Toe42                0.36                     +---------+------------------+-----+----------+--------+   +---------+------------------+-----+----------+-------+  Left    Lt Pressure (mmHg)IndexWaveform  Comment  +---------+------------------+-----+----------+-------+   Brachial 116                                       +---------+------------------+-----+----------+-------+  ATA     88                0.76 triphasic          +---------+------------------+-----+----------+-------+  PTA     92                0.79 monophasic         +---------+------------------+-----+----------+-------+  Great Toe63                0.54                    +---------+------------------+-----+----------+-------+   +-------+---------------+-----------+---------------+------------+  ABI/TBIToday's ABI    Today's TBIPrevious ABI   Previous TBI  +-------+---------------+-----------+---------------+------------+  Right Noncompressible0.36       Noncompressible0.35          +-------+---------------+-----------+---------------+------------+  Left  0.79           0.54       0.73           0.75          +-------+---------------+-----------+---------------+------------+         Bilateral ABIs and TBIs appear essentially unchanged compared to prior  study on 05/06/2023.    Summary:  Right: Resting right ankle-brachial index indicates mild right lower  extremity arterial disease. The right toe-brachial index is abnormal.   Left: Resting left ankle-brachial index indicates moderate left lower  extremity arterial disease. The left toe-brachial index is abnormal.       Assessment/Plan:     73 year old male with short distance left lower extremity claudication and possible need for ankle replacement.  ABIs consistent with claudication.  States now his symptoms are life-limiting thankfully without rest pain or tissue loss.  We discussed angiography from the right common femoral approach to evaluate both legs possibly treat the left.  We discussed that after intervention he would need dual antiplatelet therapy and lifelong surveillance.  We discussed that any long segment blockages would require surgical discussion.  All questions were  answered he demonstrates good understanding.  Alexander Hunter has atherosclerosis of the native arteries of the Left lower extremities causing disabling claudication. The patient is on best medical therapy for peripheral arterial disease. The patient has been counseled about the risks of tobacco use in atherosclerotic disease. The patient has been counseled to abstain from any tobacco use. An aortogram with bilateral lower extremity runoff angiography and Left lower extremity intervention and is indicated to better evaluate the patient's lower extremity circulation due to the life-limiting status of his arterial disease. Based on the patient's clinical exam and non-invasive data, we anticipate an endovascular intervention in the femoropopliteal vessels. Stenting and/or athrectomy would be favored because of the improved primary patency of these interventions as compared to plain balloon angioplasty.      Alexander Lonni Colorado MD [facility]

## 2024-03-16 ENCOUNTER — Telehealth: Payer: Self-pay

## 2024-03-16 NOTE — Telephone Encounter (Signed)
 Pt called to cancel next week AGM. He states he has had some issues come up that he needs to be available for and he wants to call us  when he's ready to r/s.

## 2024-03-23 ENCOUNTER — Observation Stay (HOSPITAL_COMMUNITY): Admission: RE | Admit: 2024-03-23 | Source: Home / Self Care | Admitting: Vascular Surgery

## 2024-03-23 ENCOUNTER — Encounter (HOSPITAL_COMMUNITY): Admission: RE | Payer: Self-pay | Source: Home / Self Care

## 2024-03-23 SURGERY — ABDOMINAL AORTOGRAM
Anesthesia: LOCAL

## 2024-03-27 ENCOUNTER — Other Ambulatory Visit: Payer: Self-pay | Admitting: Family Medicine

## 2024-04-20 ENCOUNTER — Telehealth: Payer: Self-pay

## 2024-04-20 NOTE — Telephone Encounter (Signed)
 Patient was scheduled for abd aortogram and LLE angio with Dr Sheree on 9/29.  Patient canceled procedure on 9/22 stating that he would call back.  No response from patient.  Letter sent to patient's home address on 10/27.

## 2024-04-27 ENCOUNTER — Ambulatory Visit (INDEPENDENT_AMBULATORY_CARE_PROVIDER_SITE_OTHER): Admitting: Family Medicine

## 2024-04-27 ENCOUNTER — Encounter: Payer: Self-pay | Admitting: Radiology

## 2024-04-27 ENCOUNTER — Encounter: Payer: Self-pay | Admitting: Family Medicine

## 2024-04-27 VITALS — BP 110/68 | HR 65 | Temp 98.2°F | Ht 66.5 in | Wt 128.0 lb

## 2024-04-27 DIAGNOSIS — Z0000 Encounter for general adult medical examination without abnormal findings: Secondary | ICD-10-CM | POA: Diagnosis not present

## 2024-04-27 DIAGNOSIS — E7800 Pure hypercholesterolemia, unspecified: Secondary | ICD-10-CM | POA: Diagnosis not present

## 2024-04-27 DIAGNOSIS — R7303 Prediabetes: Secondary | ICD-10-CM

## 2024-04-27 DIAGNOSIS — E559 Vitamin D deficiency, unspecified: Secondary | ICD-10-CM | POA: Diagnosis not present

## 2024-04-27 DIAGNOSIS — Z125 Encounter for screening for malignant neoplasm of prostate: Secondary | ICD-10-CM | POA: Diagnosis not present

## 2024-04-27 DIAGNOSIS — H6122 Impacted cerumen, left ear: Secondary | ICD-10-CM | POA: Diagnosis not present

## 2024-04-27 LAB — CBC WITH DIFFERENTIAL/PLATELET
Basophils Absolute: 0 K/uL (ref 0.0–0.1)
Basophils Relative: 1 % (ref 0.0–3.0)
Eosinophils Absolute: 0.2 K/uL (ref 0.0–0.7)
Eosinophils Relative: 3.8 % (ref 0.0–5.0)
HCT: 36.8 % — ABNORMAL LOW (ref 39.0–52.0)
Hemoglobin: 11.9 g/dL — ABNORMAL LOW (ref 13.0–17.0)
Lymphocytes Relative: 30.3 % (ref 12.0–46.0)
Lymphs Abs: 1.4 K/uL (ref 0.7–4.0)
MCHC: 32.4 g/dL (ref 30.0–36.0)
MCV: 87.4 fl (ref 78.0–100.0)
Monocytes Absolute: 0.5 K/uL (ref 0.1–1.0)
Monocytes Relative: 10.7 % (ref 3.0–12.0)
Neutro Abs: 2.6 K/uL (ref 1.4–7.7)
Neutrophils Relative %: 54.2 % (ref 43.0–77.0)
Platelets: 136 K/uL — ABNORMAL LOW (ref 150.0–400.0)
RBC: 4.21 Mil/uL — ABNORMAL LOW (ref 4.22–5.81)
RDW: 15.6 % — ABNORMAL HIGH (ref 11.5–15.5)
WBC: 4.7 K/uL (ref 4.0–10.5)

## 2024-04-27 LAB — VITAMIN D 25 HYDROXY (VIT D DEFICIENCY, FRACTURES): VITD: 30.57 ng/mL (ref 30.00–100.00)

## 2024-04-27 LAB — COMPREHENSIVE METABOLIC PANEL WITH GFR
ALT: 40 U/L (ref 0–53)
AST: 36 U/L (ref 0–37)
Albumin: 4 g/dL (ref 3.5–5.2)
Alkaline Phosphatase: 47 U/L (ref 39–117)
BUN: 25 mg/dL — ABNORMAL HIGH (ref 6–23)
CO2: 28 meq/L (ref 19–32)
Calcium: 9.2 mg/dL (ref 8.4–10.5)
Chloride: 104 meq/L (ref 96–112)
Creatinine, Ser: 1.45 mg/dL (ref 0.40–1.50)
GFR: 48.58 mL/min — ABNORMAL LOW (ref >60.00)
Glucose, Bld: 87 mg/dL (ref 70–99)
Potassium: 5 meq/L (ref 3.5–5.1)
Sodium: 138 meq/L (ref 135–145)
Total Bilirubin: 0.5 mg/dL (ref 0.2–1.2)
Total Protein: 7 g/dL (ref 6.0–8.3)

## 2024-04-27 LAB — LIPID PANEL
Cholesterol: 129 mg/dL (ref 0–200)
HDL: 83.9 mg/dL (ref >39.00)
LDL Cholesterol: 40 mg/dL (ref 0–99)
NonHDL: 44.64
Total CHOL/HDL Ratio: 2
Triglycerides: 25 mg/dL (ref 0.0–149.0)
VLDL: 5 mg/dL (ref 0.0–40.0)

## 2024-04-27 LAB — TSH: TSH: 1.05 u[IU]/mL (ref 0.35–5.50)

## 2024-04-27 LAB — PSA: PSA: 2.3 ng/mL (ref 0.10–4.00)

## 2024-04-27 LAB — HEMOGLOBIN A1C: Hgb A1c MFr Bld: 6 % (ref 4.6–6.5)

## 2024-04-27 MED ORDER — ATORVASTATIN CALCIUM 80 MG PO TABS: ORAL_TABLET | ORAL | 3 refills | Status: AC

## 2024-04-27 NOTE — Progress Notes (Unsigned)
 Complete physical exam  Patient: Alexander Hunter   DOB: 1951-05-04   73 y.o. Male  MRN: [id number]  Subjective:    Chief Complaint  Patient presents with  . Annual Exam    Alexander Hunter is a 73 y.o. male who presents today for a complete physical exam. He reports consuming a general diet. Exercise: Walking-not much. Stretching. He generally feels fairly well. He reports sleeping well. He does not have additional problems to discuss today.    Most recent fall risk assessment:    04/27/2024   10:59 AM  Fall Risk   Falls in the past year? 0  Number falls in past yr: 0  Injury with Fall? 0  Risk for fall due to : No Fall Risks  Follow up Falls evaluation completed     Most recent depression screenings:    04/27/2024   10:59 AM 11/01/2023   11:19 AM  PHQ 2/9 Scores  PHQ - 2 Score 0 0  PHQ- 9 Score 0     Vision:Within last year and Dental: No current dental problems and Receives regular dental care  Past Medical History:  Diagnosis Date  . Back pain    states has been like this for yrs  . Coronary artery disease   . History of blood transfusion 11/2014   no abnormal reaction noted  . HLD (hyperlipidemia)    takes Atorvastatin  daily  . Insomnia    doesn't take any meds  . Myocardial infarction (HCC) 11/2014  . Nocturia   . Pneumonia    as a child  . Shortness of breath dyspnea    rarely but notices with exertion.States doesn't happen during cardiac reheab   Past Surgical History:  Procedure Laterality Date  . CARDIAC CATHETERIZATION  12-16-14  . CORONARY ARTERY BYPASS GRAFT  11/2014   x 4-done at [facility]  . HERNIA REPAIR     as a child  . INGUINAL HERNIA REPAIR Bilateral 06/01/2015   Procedure: OPEN  BILATERAL HERNIA REPAIR;  Surgeon: Herlene Righter Kinsinger, MD;  Location: [facility] OR;  Service: General;  Laterality: Bilateral;  . INSERTION OF MESH Bilateral 06/01/2015   Procedure: INSERTION OF MESH;  Surgeon: Herlene Righter Bureau, MD;  Location: [facility] OR;  Service: General;   Laterality: Bilateral;  . TONSILLECTOMY AND ADENOIDECTOMY     as a child   Social History   Tobacco Use  . Smoking status: Former    Current packs/day: 0.00    Average packs/day: 1 pack/day for 30.0 years (30.0 ttl pk-yrs)    Types: Cigarettes    Start date: 41    Quit date: 2016    Years since quitting: 9.8  . Smokeless tobacco: Never  . Tobacco comments:    Quit smoking 12/2014  Vaping Use  . Vaping status: Never Used  Substance Use Topics  . Alcohol use: Yes    Comment: 1 mixed drink a day and 2 beers.  . Drug use: No   Social History   Socioeconomic History  . Marital status: Single    Spouse name: Not on file  . Number of children: 1  . Years of education: Not on file  . Highest education level: Some college, no degree  Occupational History  . Occupation: Retired  Tobacco Use  . Smoking status: Former    Current packs/day: 0.00    Average packs/day: 1 pack/day for 30.0 years (30.0 ttl pk-yrs)    Types: Cigarettes    Start date: 1  Quit date: 2016    Years since quitting: 9.8  . Smokeless tobacco: Never  . Tobacco comments:    Quit smoking 12/2014  Vaping Use  . Vaping status: Never Used  Substance and Sexual Activity  . Alcohol use: Yes    Comment: 1 mixed drink a day and 2 beers.  . Drug use: No  . Sexual activity: Not Currently  Other Topics Concern  . Not on file  Social History Narrative  . Not on file   Social Drivers of Health   Financial Resource Strain: Low Risk  (11/01/2023)   Overall Financial Resource Strain (CARDIA)   . Difficulty of Paying Living Expenses: Not hard at all  Food Insecurity: No Food Insecurity (11/01/2023)   Hunger Vital Sign   . Worried About Programme Researcher, Broadcasting/film/video in the Last Year: Never true   . Ran Out of Food in the Last Year: Never true  Transportation Needs: No Transportation Needs (11/01/2023)   PRAPARE - Transportation   . Lack of Transportation (Medical): No   . Lack of Transportation (Non-Medical): No   Physical Activity: Insufficiently Active (11/01/2023)   Exercise Vital Sign   . Days of Exercise per Week: 3 days   . Minutes of Exercise per Session: 10 min  Stress: No Stress Concern Present (11/01/2023)   [employer] of Occupational Health - Occupational Stress Questionnaire   . Feeling of Stress : Not at all  Social Connections: Socially Isolated (11/01/2023)   Social Connection and Isolation Panel   . Frequency of Communication with Friends and Family: More than three times a week   . Frequency of Social Gatherings with Friends and Family: More than three times a week   . Attends Religious Services: Never   . Active Member of Clubs or Organizations: No   . Attends Banker Meetings: Never   . Marital Status: Divorced  Catering Manager Violence: Not At Risk (11/01/2023)   Humiliation, Afraid, Rape, and Kick questionnaire   . Fear of Current or Ex-Partner: No   . Emotionally Abused: No   . Physically Abused: No   . Sexually Abused: No   Family Status  Relation Name Status  . Mother  Deceased  . Father  Deceased  . Brother  Deceased  . Son  Alive  . MGM  (Not Specified)  . MGF  (Not Specified)  No partnership data on file   Family History  Problem Relation Age of Onset  . Heart attack Mother   . Heart disease Mother        Heart attack  . Heart attack Father   . Heart disease Father        Heart attack  . Heart failure Brother   . COPD Brother   . Asthma Son        As a child  . Heart attack Maternal Grandmother   . Heart disease Maternal Grandmother        Heart attack  . Heart attack Maternal Grandfather   . Heart disease Maternal Grandfather        Heart attack   No Known Allergies   Patient Care Team: Billy Philippe SAUNDERS, NP as PCP - General (Family Medicine) Lonni Slain, MD as PCP - Cardiology (Cardiology) Sheree Penne Lonni, MD as Consulting Physician (Vascular Surgery) Sheldon Hum (Orthotics) Ruthell Lauraine FALCON, NP as  Nurse Practitioner (Pulmonary Disease)   Outpatient Medications Prior to Visit  Medication Sig  . aspirin  EC 81 MG tablet  Take 1 tablet (81 mg total) by mouth daily. Swallow whole.  . diclofenac  Sodium (VOLTAREN ) 1 % GEL Apply 2 g topically 4 (four) times daily.  . Evolocumab  (REPATHA  SURECLICK) 140 MG/ML SOAJ Inject 140 mg into the skin every 14 (fourteen) days. Please apply healthwell grant and deliver  . ezetimibe  (ZETIA ) 10 MG tablet Take 1 tablet by mouth once daily  . lisinopril  (ZESTRIL ) 5 MG tablet Take 1 tablet (5 mg total) by mouth daily.  . metoprolol  succinate (TOPROL  XL) 25 MG 24 hr tablet Take 1 tablet (25 mg total) by mouth daily. Take with or immediately following a meal.  . Multiple Vitamin (ONE-A-DAY MENS PO) Take by mouth. Per patient gel caps  . [DISCONTINUED] atorvastatin  (LIPITOR) 80 MG tablet Take 1 tablet by mouth each evening.   No facility-administered medications prior to visit.    Review of Systems  Musculoskeletal:  Positive for neck pain.   See HPI above    Objective:   BP 110/68   Pulse 65   Temp 98.2 F (36.8 C) (Oral)   Ht 5' 6.5 (1.689 m)   Wt 128 lb (58.1 kg)   SpO2 98%   BMI 20.35 kg/m  {Vitals History (Optional):23777}  Physical Exam   No results found for any visits on 04/27/24. {Show previous labs (optional):23779}    Assessment & Plan:    Routine Health Maintenance and Physical Exam  Immunization History  Administered Date(s) Administered  . PFIZER(Purple Top)SARS-COV-2 Vaccination 11/17/2019, 12/08/2019, 06/14/2020  . PPD Test 12/30/2014  . Pfizer Covid-19 Vaccine Bivalent Booster 39yrs & up 11/17/2019, 12/08/2019, 06/21/2020, 11/24/2020, 04/12/2021  . Pneumococcal Conjugate-13 05/13/2019  . Pneumococcal Polysaccharide-23 06/02/2015  . Tdap 06/16/2009    Health Maintenance  Topic Date Due  . Zoster Vaccines- Shingrix (1 of 2) Never done  . DTaP/Tdap/Td (2 - Td or Tdap) 06/17/2019  . Influenza Vaccine  Never done  .  COVID-19 Vaccine (9 - 2025-26 season) 02/24/2024  . Pneumococcal Vaccine: 50+ Years (3 of 3 - PCV20 or PCV21) 05/12/2024  . Medicare Annual Wellness (AWV)  10/31/2024  . Lung Cancer Screening  11/28/2024  . Colonoscopy  08/22/2026  . Hepatitis C Screening  Completed  . Meningococcal B Vaccine  Aged Out    Discussed health benefits of physical activity, and encouraged him to engage in regular exercise appropriate for his age and condition.  Annual physical exam -     CBC with Differential/Platelet -     Comprehensive metabolic panel with GFR -     TSH  Hypercholesteremia -     Atorvastatin  Calcium ; Take 1 tablet by mouth each evening.  Dispense: 90 tablet; Refill: 3 -     Comprehensive metabolic panel with GFR -     Lipid panel  Prediabetes -     Hemoglobin A1c  Vitamin D  deficiency -     VITAMIN D  25 Hydroxy (Vit-D Deficiency, Fractures)  Prostate cancer screening -     PSA  Impacted cerumen of left ear -     Ear Lavage  1.Review health maintenance: -Tdap vaccine:  -Covid booster:  -Zoster vaccine: Declines  -Influenza vaccine: Declines  -PNA vaccine:   Return in about 6 months (around 10/25/2024) for chronic management, separate appointment for neck pain .     Marvelle Caudill, NP

## 2024-04-27 NOTE — Patient Instructions (Addendum)
-  It was great to see you today.  -Physical exam completed today.  -Left ear lavage with success.  -Ordered labs. Office will call with lab results and will be available via MyChart. -Continue all medications. Refilled Atorvastatin .  -May get vaccines at your local pharmacy.  -Continue to work on a healthy diet and regular exercise. -Follow up in 6 months for chronic management and a follow up to discuss further about neck pain.

## 2024-04-30 ENCOUNTER — Other Ambulatory Visit

## 2024-04-30 ENCOUNTER — Ambulatory Visit: Payer: Self-pay | Admitting: Family Medicine

## 2024-04-30 DIAGNOSIS — D649 Anemia, unspecified: Secondary | ICD-10-CM

## 2024-05-01 ENCOUNTER — Ambulatory Visit: Payer: Self-pay | Admitting: Family Medicine

## 2024-05-01 LAB — IRON,TIBC AND FERRITIN PANEL
%SAT: 19 % — ABNORMAL LOW (ref 20–48)
Ferritin: 69 ng/mL (ref 24–380)
Iron: 60 ug/dL (ref 50–180)
TIBC: 323 ug/dL (ref 250–425)

## 2024-06-13 ENCOUNTER — Other Ambulatory Visit: Payer: Self-pay | Admitting: Family Medicine

## 2024-06-26 ENCOUNTER — Encounter (HOSPITAL_BASED_OUTPATIENT_CLINIC_OR_DEPARTMENT_OTHER): Payer: Self-pay | Admitting: Cardiology

## 2024-06-26 ENCOUNTER — Ambulatory Visit (HOSPITAL_BASED_OUTPATIENT_CLINIC_OR_DEPARTMENT_OTHER): Admitting: Cardiology

## 2024-06-26 VITALS — BP 110/66 | HR 69 | Ht 67.0 in | Wt 131.1 lb

## 2024-06-26 DIAGNOSIS — I255 Ischemic cardiomyopathy: Secondary | ICD-10-CM

## 2024-06-26 DIAGNOSIS — I739 Peripheral vascular disease, unspecified: Secondary | ICD-10-CM

## 2024-06-26 DIAGNOSIS — Z7189 Other specified counseling: Secondary | ICD-10-CM | POA: Diagnosis not present

## 2024-06-26 DIAGNOSIS — I493 Ventricular premature depolarization: Secondary | ICD-10-CM

## 2024-06-26 DIAGNOSIS — Z951 Presence of aortocoronary bypass graft: Secondary | ICD-10-CM

## 2024-06-26 DIAGNOSIS — I25119 Atherosclerotic heart disease of native coronary artery with unspecified angina pectoris: Secondary | ICD-10-CM | POA: Diagnosis not present

## 2024-06-26 DIAGNOSIS — E7800 Pure hypercholesterolemia, unspecified: Secondary | ICD-10-CM

## 2024-06-26 NOTE — Patient Instructions (Signed)

## 2024-06-26 NOTE — Progress Notes (Signed)
 " Cardiology Office Note:  .   Date:  06/26/2024  ID:  Danna Casella, DOB Oct 11, 1950, MRN [id number] PCP: Billy Philippe SAUNDERS, NP  [facility] Providers Cardiologist:  Shelda Bruckner, MD {  History of Present Illness: .   Jedadiah Abdallah is a 74 y.o. male with a hx of CABG 2016 ([facility]) after presenting with inferior STEMI and unable to open RCA with stent, ischemic cardiomyopathy, PVCs, PAD. I met him 11/30/22.  Pertinent CV history: ischemic heart disease status post bypass surgery June 2016 (LIMA to the LAD, SVG to ramus, SVG to obtuse marginal, SVG to PDA). Presented with acute MI, had failed PCI to RCA, then sent to bypass. Prior EF 45-50% in 2018. Abnormal ABIs without symptoms. Repeat ABI 04/2023 with noncompressible study on the right, moderate PAD on the left. Repeat ABI in 2025 was unchanged  Monitor 10/2023 showed 23% PVC burden (daily range 13-32%). Echo showed EF 50-55%, with basal inferior and septal akinesis. Does not have a prior echo here (prior workup done at [facility], cannot see full results) but on note from 03/16/2027, noted that exercise nuclear showed no ischemia but inferior scar (did 7 METs), echo with EF 45-50% with inferior wall hypokinesis.  Presenting symptoms at time of MI was diaphoresis, not chest pain.  Today: Overall doing well. He does not feel his PVCs. Tolerating metoprolol . Tries to stay hydrated. He likes to walk, but he is most limited by arthritis in his left ankle. Does a lot of stretching exercises. Not walking much due to arthritis. He has followed closely with [facility], has discussed angiogram and stenting with Dr. Sheree, especially if ankle surgery needed in the future. No rest pain or tissue loss, difficult to assess claudication given that his ankle pain significantly limits him.   ROS: Denies chest pain, shortness of breath at rest or with normal exertion. No PND, orthopnea, LE edema or unexpected weight gain. No syncope. ROS otherwise negative except  as noted.   Studies Reviewed: SABRA    EKG:       Physical Exam:   VS:  BP 110/66 (BP Location: Right Arm, Patient Position: Sitting, Cuff Size: Normal)   Pulse 69   Ht 5' 7 (1.702 m)   Wt 131 lb 1.6 oz (59.5 kg)   SpO2 99%   BMI 20.53 kg/m    Wt Readings from Last 3 Encounters:  06/26/24 131 lb 1.6 oz (59.5 kg)  04/27/24 128 lb (58.1 kg)  03/11/24 130 lb (59 kg)    GEN: Well nourished, well developed in no acute distress HEENT: Normal, moist mucous membranes NECK: No JVD CARDIAC: regular rhythm with occasional ectopy, normal S1 and S2, no rubs or gallops. No murmur. VASCULAR: Radial pulses 2+ bilaterally. DP pulses not palpable bilaterally. No carotid bruits RESPIRATORY:  Clear to auscultation without rales, wheezing or rhonchi  ABDOMEN: Soft, non-tender, non-distended MUSCULOSKELETAL:  Ambulates independently SKIN: Warm and dry, no edema NEUROLOGIC:  Alert and oriented x 3. No focal neuro deficits noted. PSYCHIATRIC:  Normal affect    ASSESSMENT AND PLAN: .    CAD s/p prior CABG 2016 Ischemic cardiomyopathy Hypercholesterolemia PAD -last LDL 40, much improved with addition of repatha . Continue repatha , atorvastatin  80 mg daily, ezetimibe  10 mg daily -continue aspirin  81 mg daily -Echo with focal WMA as noted, low normal EF. Tolerating low dose metoprolol  succinate and lisinopril  5 mg daily. No BP room to increase -NYHA class 1 -reviewed his ABIs, stable but abnormal. No rest pain or tissue  loss. Notes that he is not walking much due to ankle arthritis. No firm plan for ankle surgery. Dr. Sheree is following, they have discussed angiogram and possible intervention, especially if ankle surgery needed. -reviewed red flag warning signs that need immediate medical attention  PVCs -see monitor, echo above -he is asymptomatic -tolerating metoprolol  succinate 25 mg daily  CV risk counseling and prevention -recommend heart healthy/Mediterranean diet, with whole grains, fruits,  vegetable, fish, lean meats, nuts, and olive oil. Limit salt. -recommend moderate walking, 3-5 times/week for 30-50 minutes each session. Aim for at least 150 minutes.week. Goal should be pace of 3 miles/hours, or walking 1.5 miles in 30 minutes -recommend avoidance of tobacco products. Avoid excess alcohol.  Dispo: 6 mos or sooner as needed  Signed, Shelda Bruckner, MD   Shelda Bruckner, MD, PhD, [facility]  [facility]  [place]  [facility] V[facility] at [facility] [address], [address] [place], [place] [address] [phone]   "

## 2024-07-11 ENCOUNTER — Other Ambulatory Visit: Payer: Self-pay | Admitting: Family Medicine

## 2024-11-06 ENCOUNTER — Ambulatory Visit
